# Patient Record
Sex: Female | Born: 1980 | Race: Asian | Hispanic: No | Marital: Single | Smoking: Never smoker
Health system: Southern US, Community
[De-identification: ages and names within clinical notes are randomized; demographics above are authoritative.]

---

## 2001-10-02 ENCOUNTER — Inpatient Hospital Stay (HOSPITAL_COMMUNITY): Admission: AD | Admit: 2001-10-02 | Discharge: 2001-10-04 | Payer: Self-pay | Admitting: Obstetrics and Gynecology

## 2001-11-03 ENCOUNTER — Observation Stay (HOSPITAL_COMMUNITY): Admission: AD | Admit: 2001-11-03 | Discharge: 2001-11-04 | Payer: Self-pay | Admitting: *Deleted

## 2002-02-28 ENCOUNTER — Inpatient Hospital Stay (HOSPITAL_COMMUNITY): Admission: AD | Admit: 2002-02-28 | Discharge: 2002-03-02 | Payer: Self-pay | Admitting: Obstetrics and Gynecology

## 2005-11-15 ENCOUNTER — Other Ambulatory Visit: Admission: RE | Admit: 2005-11-15 | Discharge: 2005-11-15 | Payer: Self-pay | Admitting: Obstetrics and Gynecology

## 2006-01-24 ENCOUNTER — Emergency Department (HOSPITAL_COMMUNITY): Admission: EM | Admit: 2006-01-24 | Discharge: 2006-01-24 | Payer: Self-pay | Admitting: Emergency Medicine

## 2006-10-07 ENCOUNTER — Inpatient Hospital Stay (HOSPITAL_COMMUNITY): Admission: AD | Admit: 2006-10-07 | Discharge: 2006-10-09 | Payer: Self-pay | Admitting: Obstetrics and Gynecology

## 2006-10-10 ENCOUNTER — Encounter: Admission: RE | Admit: 2006-10-10 | Discharge: 2006-11-08 | Payer: Self-pay | Admitting: Obstetrics and Gynecology

## 2007-06-21 ENCOUNTER — Emergency Department (HOSPITAL_COMMUNITY): Admission: EM | Admit: 2007-06-21 | Discharge: 2007-06-21 | Payer: Self-pay | Admitting: Family Medicine
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
## 2007-09-29 ENCOUNTER — Observation Stay (HOSPITAL_COMMUNITY): Admission: AD | Admit: 2007-09-29 | Discharge: 2007-09-30 | Payer: Self-pay | Admitting: Obstetrics and Gynecology

## 2007-10-04 ENCOUNTER — Inpatient Hospital Stay (HOSPITAL_COMMUNITY): Admission: AD | Admit: 2007-10-04 | Discharge: 2007-10-07 | Payer: Self-pay | Admitting: Obstetrics and Gynecology

## 2008-01-12 ENCOUNTER — Encounter: Admission: RE | Admit: 2008-01-12 | Discharge: 2008-01-12 | Payer: Self-pay | Admitting: Obstetrics and Gynecology

## 2008-01-26 HISTORY — PX: LAPAROSCOPY: SHX197

## 2008-01-29 ENCOUNTER — Ambulatory Visit (HOSPITAL_COMMUNITY): Admission: RE | Admit: 2008-01-29 | Discharge: 2008-01-29 | Payer: Self-pay | Admitting: Obstetrics and Gynecology

## 2009-02-25 ENCOUNTER — Emergency Department (HOSPITAL_COMMUNITY): Admission: EM | Admit: 2009-02-25 | Discharge: 2009-02-25 | Payer: Self-pay | Admitting: Family Medicine

## 2009-04-14 IMAGING — US US OB COMP +14 WK
1 series · 14 of 28 positions shown · non-contrast
Comparison: none

OBSTETRICAL ULTRASOUND:

 This ultrasound exam was performed in the [HOSPITAL] Ultrasound Department.  The OB US report was generated in the AS system, and faxed to the ordering physician.  This report is also available in [REDACTED] PACS.

[Series 1: us ob comp +14 wk · 14 of 40 slices shown]
[im 2/40]
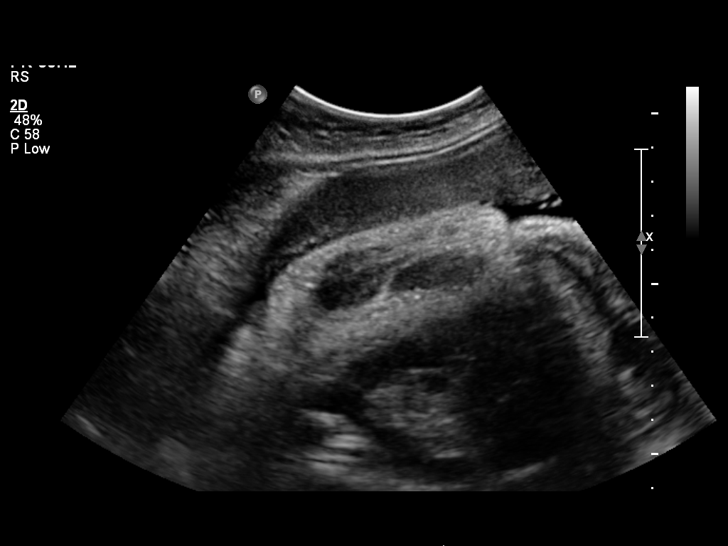
[im 5/40]
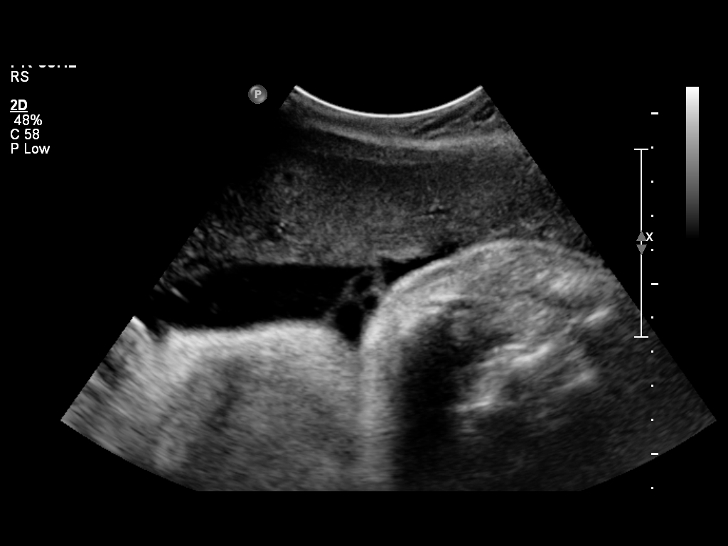
[im 8/40]
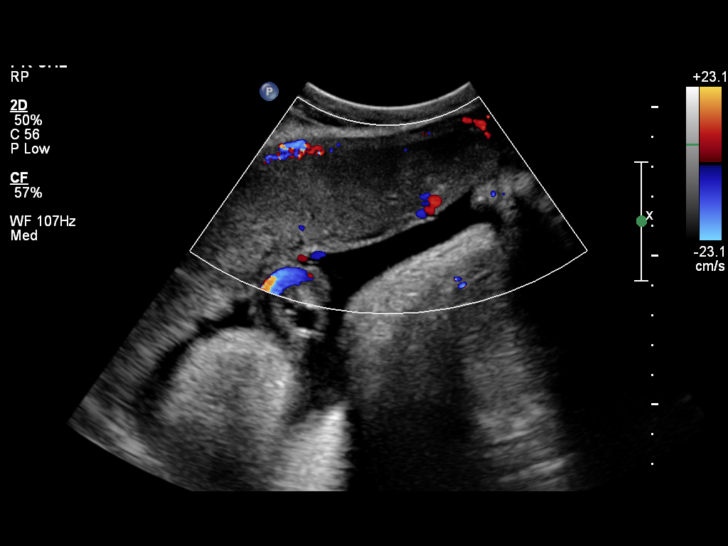
[im 11/40]
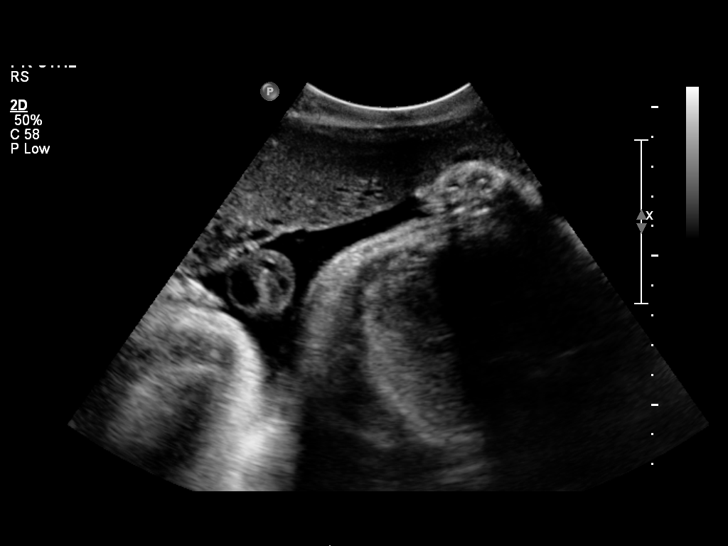
[im 14/40]
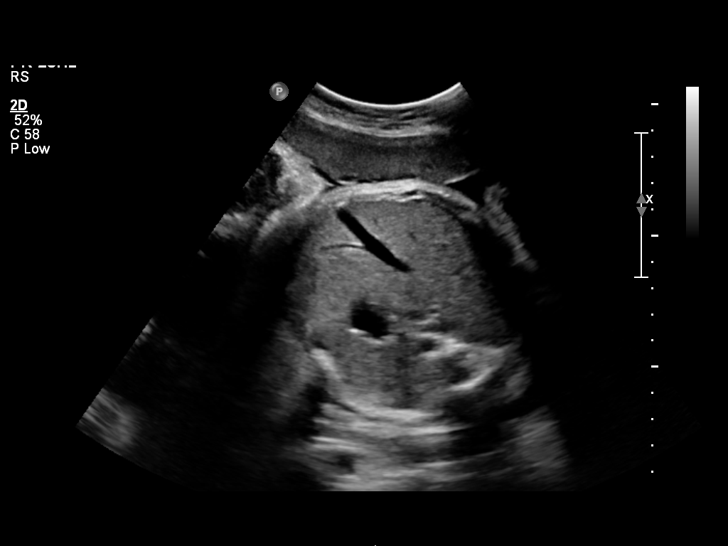
[im 16/40]
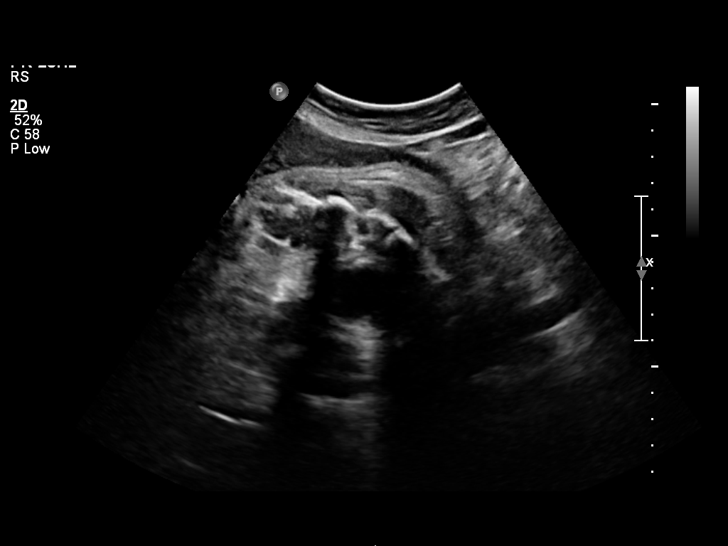
[im 19/40]
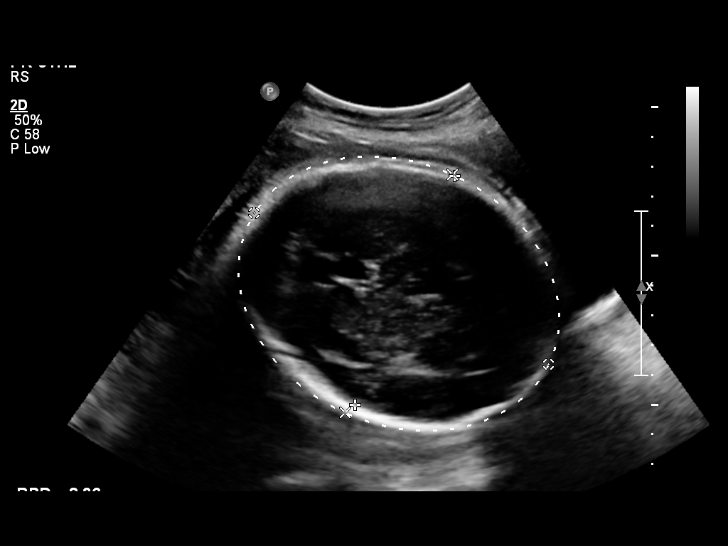
[im 22/40]
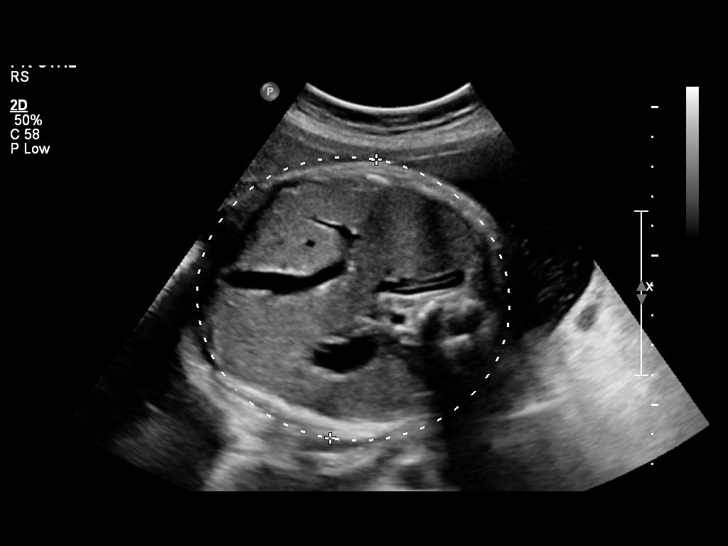
[im 25/40]
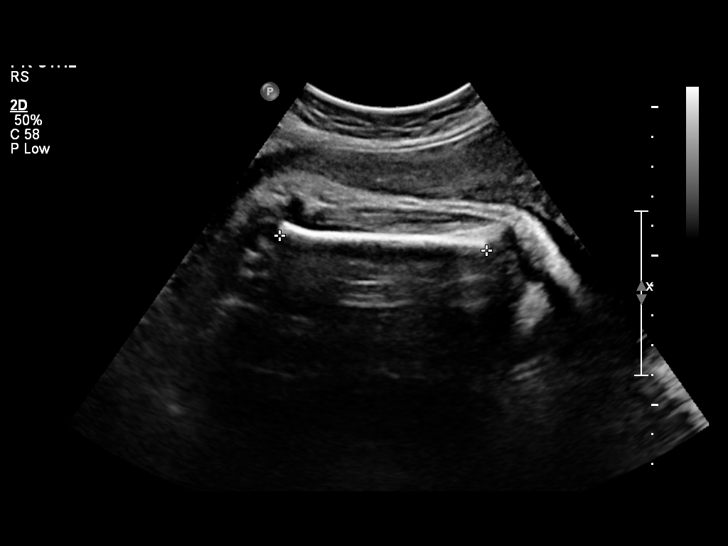
[im 28/40]
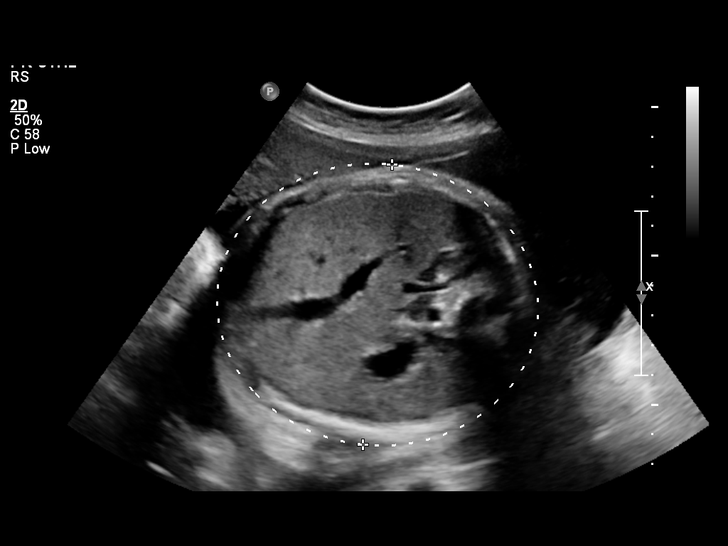
[im 31/40]
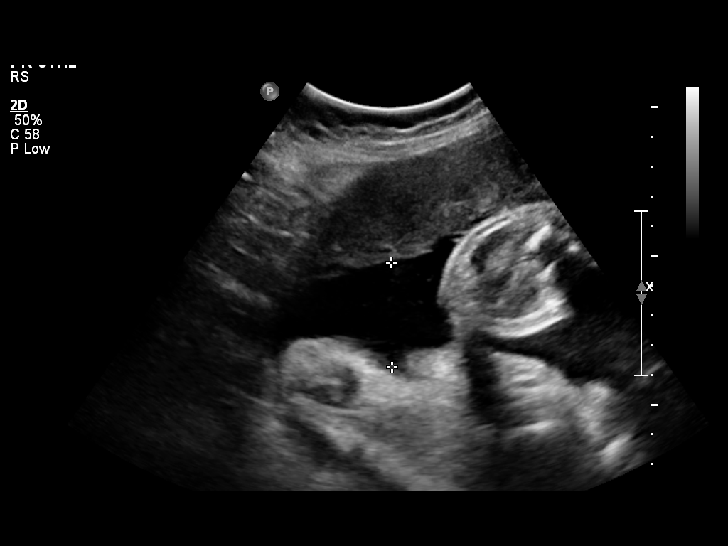
[im 34/40]
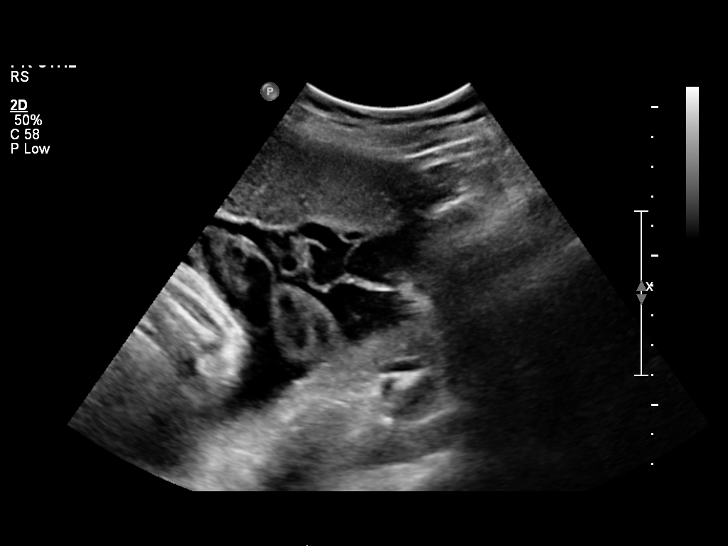
[im 37/40]
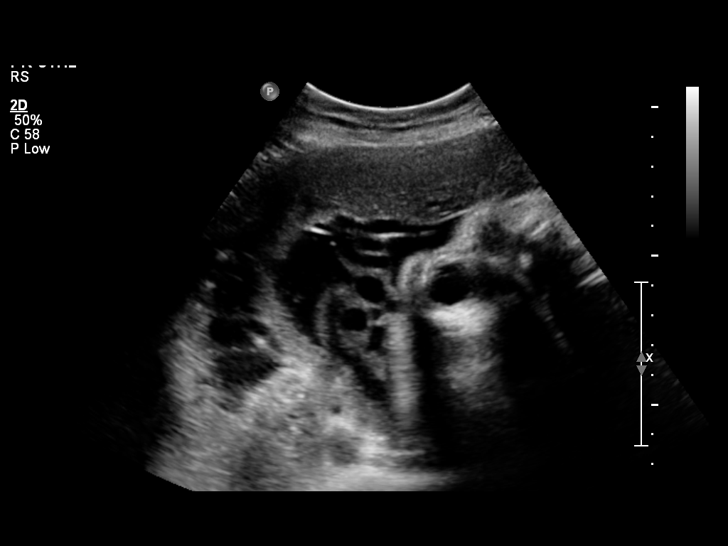
[im 40/40]
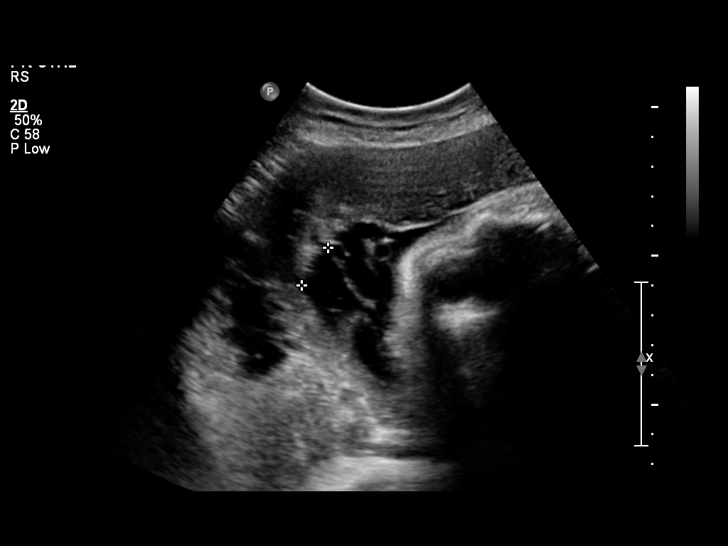

[14 of 28 positions shown; findings below may reference images not displayed]

IMPRESSION: See AS Obstetric US report.

## 2009-07-28 IMAGING — CR DG ABDOMEN 1V
1 series · 1 of 1 positions shown · non-contrast
Comparison: CT scan of the abdomen 01/24/2006.

CLINICAL DATA: Abdominal pain.  Intrauterine device.

ABDOMEN - 1 VIEW

[t abdomen supine]
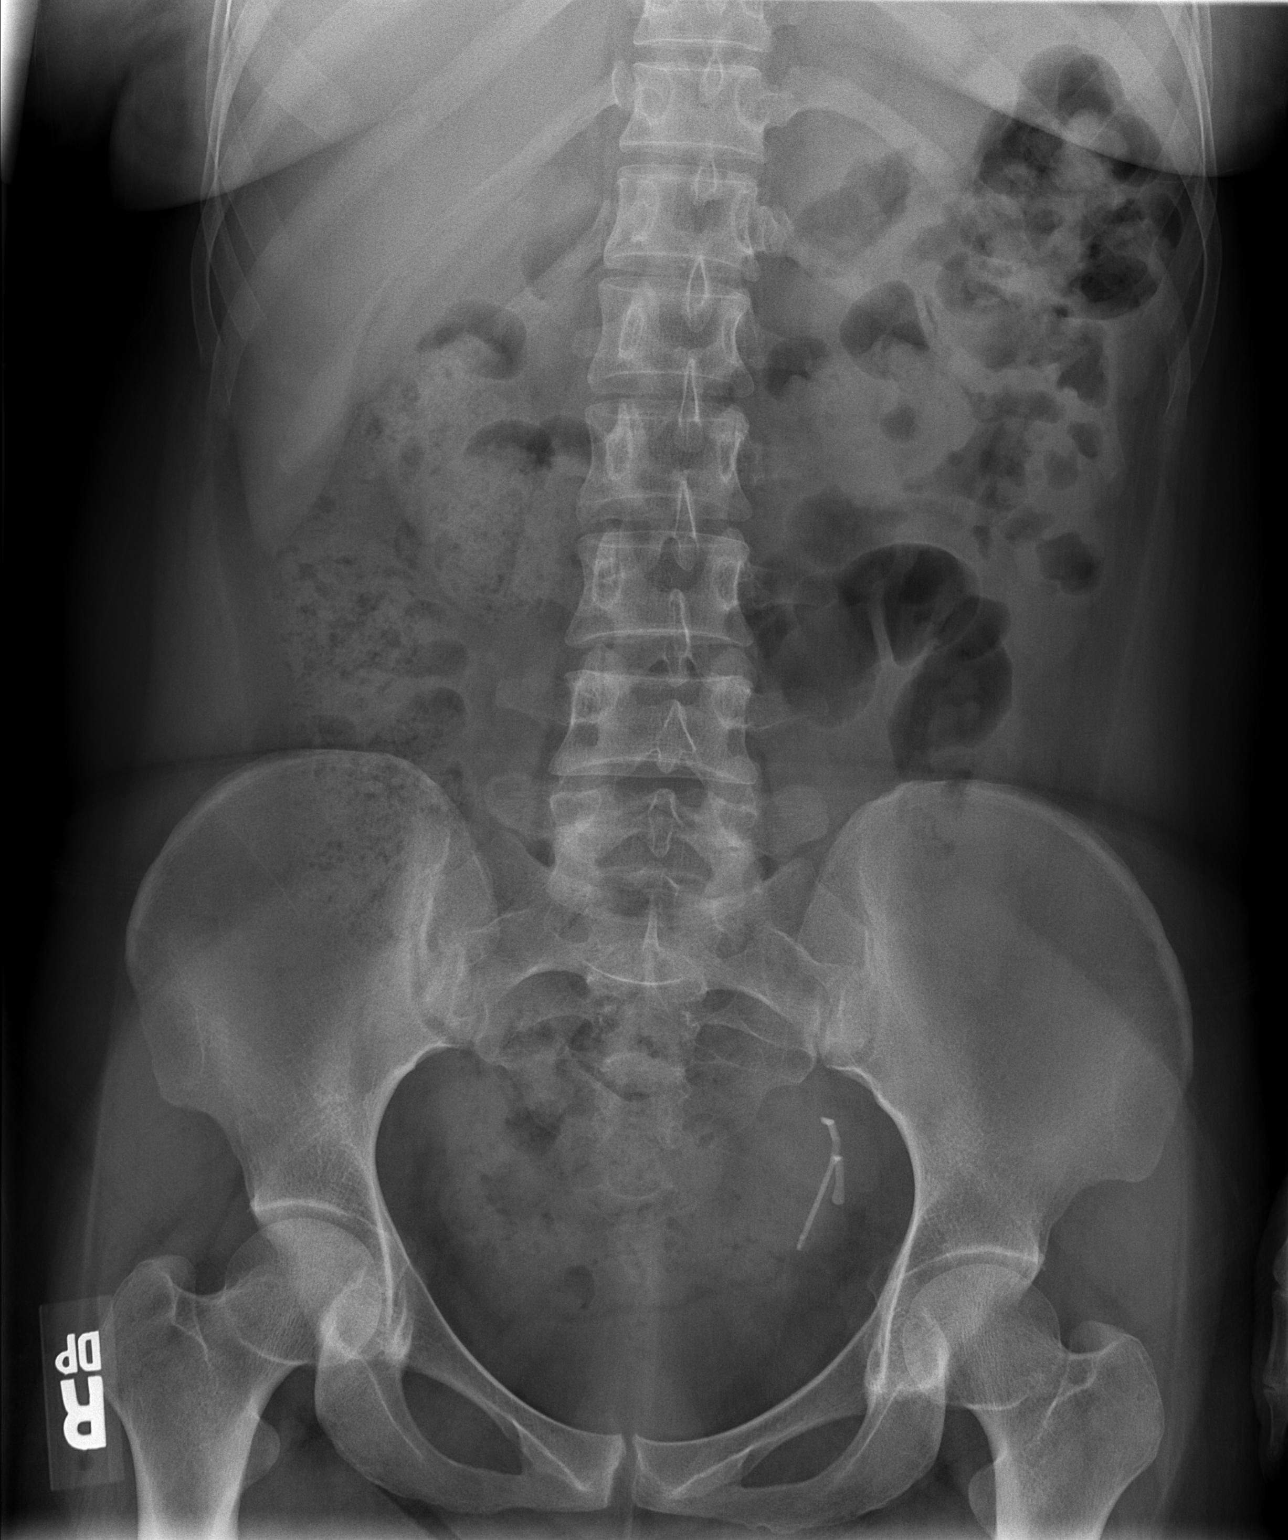

[1 of 1 positions shown; findings below may reference images not displayed]

FINDINGS: The bowel gas pattern is normal.  There is retained stool
noted within the colon.  There is an intrauterine device present
which projects within the left pelvis and may reside outside of the
the uterus.  Dr. Lajecleyda discussed these findings with Dr. Yacoub
who states that a pelvic ultrasound has been performed and the IUD
could not be located within the uterus on the pelvic ultrasound
study. As a result, this is likely in an extra uterine location.
IMPRESSION: Intrauterine device which projects to the left of midline within
the pelvis and is likely in an extrauterine location as discussed
above.  Dr. Lajecleyda discussed these findings with Dr. Yacoub at
[DATE] a.m.. Normal bowel gas pattern.

## 2009-08-14 IMAGING — US US INTRAOPERATIVE - NO REPORT
1 series · 3 of 3 positions shown · non-contrast
Comparison: none

CLINICAL DATA: LOST IUD 

Ultrasound was utilized in the operating room by the requesting physician.

[Series 1: us intraoperative - no report · 0.23mm/px · 3 of 3 slices shown]
[im 1/3]
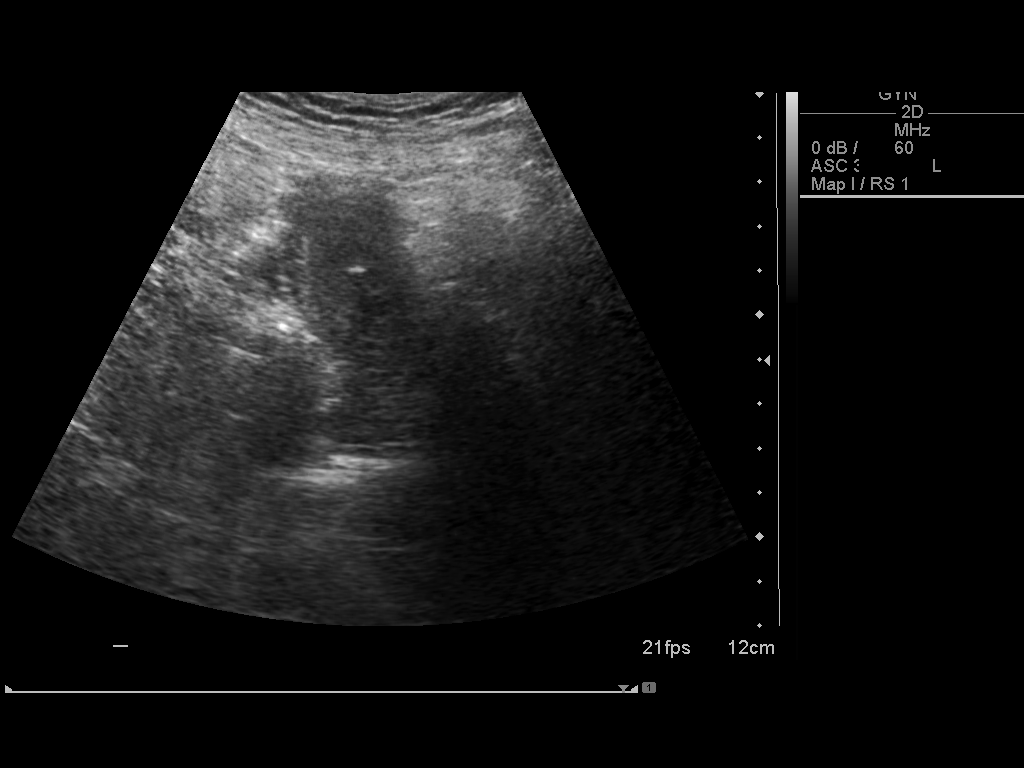
[im 2/3]
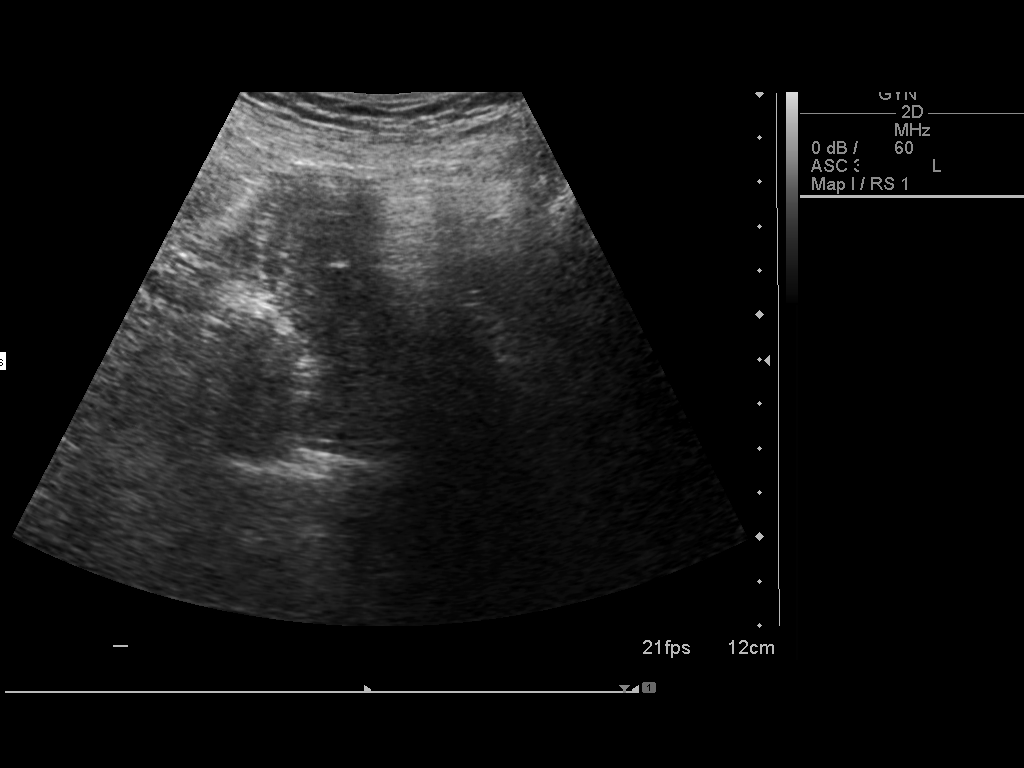
[im 3/3]
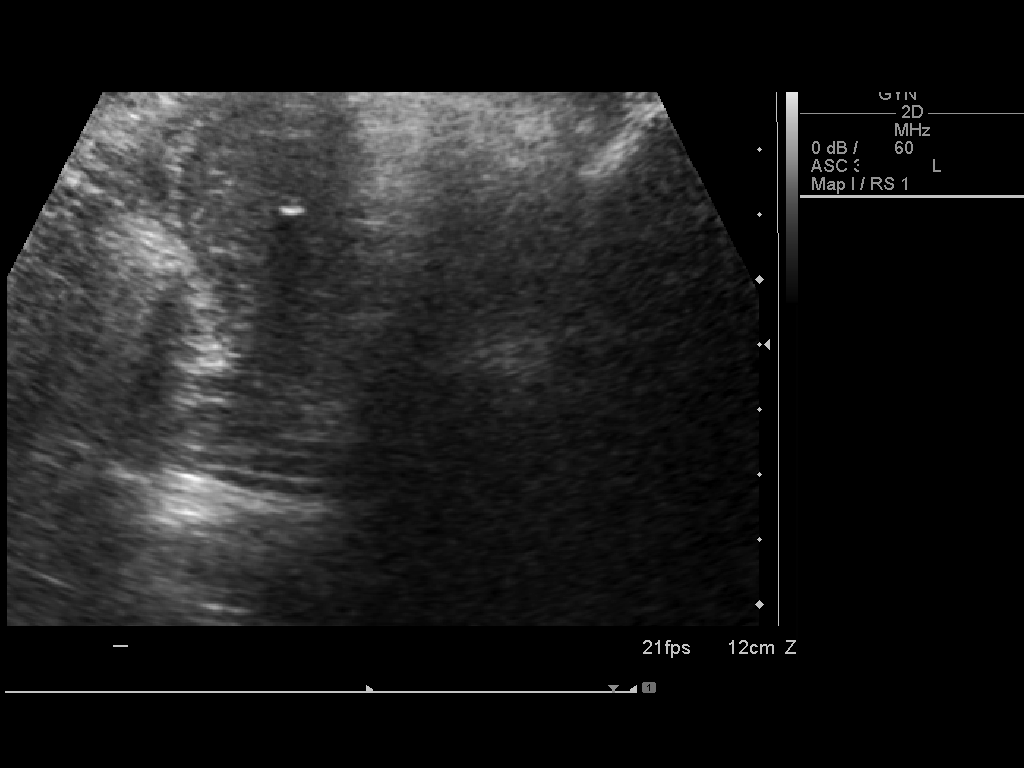

[3 of 3 positions shown; findings below may reference images not displayed]

## 2010-10-18 ENCOUNTER — Encounter: Payer: Self-pay | Admitting: Obstetrics and Gynecology

## 2011-02-09 NOTE — Discharge Summary (Signed)
[name], [name]                 ACCOUNT NO.:  [id number]   MEDICAL RECORD NO.:  [id number]          PATIENT TYPE:  INP   LOCATION:  9157                          FACILITY:  [facility]   PHYSICIAN:  Janine Limbo, M.D.DATE OF BIRTH:  02/14/1981   DATE OF ADMISSION:  09/29/2007  DATE OF DISCHARGE:  09/30/2007                               DISCHARGE SUMMARY   ADMITTING DIAGNOSES:  1. Intrauterine pregnancy at 58 and 6/7 weeks.  2. Status post fall.  3. Intrauterine growth restriction.  4. Irregular uterine contractions without cervical change.   DISCHARGE DIAGNOSES:  1. Intrauterine pregnancy at 51 and 0/7 weeks.  2. Intrauterine contractions, resolved.  3. Intrauterine growth restrictions.   HOSPITAL COURSE:  [name] is a 30 year old gravida 3, para 2-0-0-1  who was admitted at 25 and 6/7 weeks for continuous fetal monitoring due  to a fall.  The patient had noted increased cramping after falling and  landing on her right knee as well as the right side of her abdomen.  The  patient was initially admitted and was noted to be having uterine  contractions in a regular pattern approximately every 1-4 minutes.  The  patient's cervix was noted to be approximately 2-3 cm, 50% effaced, -3  station at the time of admission.  A Kleihauer-Betke was obtained which  returned with negative results.  The patient also underwent ultrasound  to assess placenta and rule out abruption.  Ultrasound returned with  findings of estimated fetal weight at the 37 percentile, 5 pounds 15  ounces and normal fetal indices as well as a normal amniotic fluid index  of 17.64 cm.  Placenta with marginal cord insertion, however, anterior  presentation with no previa noted.  No placental wall abruption was  identified the patient's contractions were treated with Stadol and  Phenergan and Ambien and contractions spaced out to every 7-10 minutes  irregularly after sleeping.  The patient's cervix remained unchanged  at  2-3 cm, 50% effaced, -3 station which is essentially unchanged.  The  patient's fetus remained reactive and reassuring throughout her hospital  stay.  The patient with no leakage of fluid or vaginal bleeding.  The  risks, benefits and alternatives of induction of labor versus discharge  to home were discussed with the patient and her husband via interpreter  with by Dr. Stefano Gaul as well as Debra [name], certified nurse midwife.  At  the present time the patient desires discharge to home.  The patient was  considered to have received full benefit of her hospital stay and was  felt to be stable with reassuring fetal status at the time of discharge.   DISCHARGE INSTRUCTIONS:  The patient was given careful labor  instructions as well as fetal kick counts.   DISCHARGE MEDICATIONS:  Prenatal vitamins.   DISCHARGE FOLLOWUP:  Monday, October 02, 2007, at 3:30 p.m. as previously  scheduled at [facility].      Debra [name], CNM      Janine Limbo, M.D.  Electronically Signed    NOS/MEDQ  D:  09/30/2007  T:  09/30/2007  Job:  [id number]
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
## 2011-02-09 NOTE — Op Note (Signed)
Debra [name], Debra [name]                 ACCOUNT NO.:  [id number]   MEDICAL RECORD NO.:  [id number]          PATIENT TYPE:  AMB   LOCATION:  [facility]C                           [facility]ILITY:  [facility]   PHYSICIAN:  Janine Limbo, M.D.DATE OF BIRTH:  July 15, 1981   DATE OF PROCEDURE:  01/29/2008  DATE OF DISCHARGE:                               OPERATIVE REPORT   PREOPERATIVE DIAGNOSES:  1. Lost Mirena intrauterine device.  2. Desires contraception.   POSTOPERATIVE DIAGNOSES:  1. Lost Mirena intrauterine device.  2. Desires contraception.  3. Perforated uterus.   PROCEDURE:  1. Diagnostic laparoscopy.  2. Laparoscopic removal of Mirena intrauterine device.  3. Placement of Mirena intrauterine device.  4. Ultrasound.   SURGEON:  Janine Limbo, MD   FIRST ASSISTANT:  None.   ANESTHESIA:  General.   DISPOSITION:  Debra [name] is a 30 year old female, para 3-0-0-2, who  presents with an intrauterine device that was found to be within the  abdominal cavity by a CT scan.  The IUD was originally placed in  February 2009.  The patient understands the indications for her surgical  procedure and she accepts the risks of, but not limited to, anesthetic  complications, bleeding, infections, and possible damage to the  surrounding organs.   FINDINGS:  On examination under anesthesia, the uterus was noted to be  anterior and normal sized.  No adnexal masses were appreciated.  On  laparoscopy, the patient was found to have a perforation on the anterior  portion of the uterus and the IUD string was noted to be partially  inside the uterus, but partially outside the uterus.  The Mirena IUD was  located on the posterior portion of the uterus beneath the left adnexa.  A Mirena IUD was placed, but upon inspection of the abdomen was noted to  be partially perforated through the uterus.  An ultrasound was performed  after the IUD was pulled back inside the uterine cavity and the ICD was  noted to be in the  proper intrauterine location.  The fallopian tubes  and ovaries appeared normal.  The abdominal contents appeared normal.  There was minimal bleeding from either perforation site.   PROCEDURE IN DETAIL:  The patient was taken to the operating room where  a general anesthetic was given.  The patient's abdomen, perineum, and  vagina were prepped with multiple layers of Betadine.  Examination under  anesthesia was performed.  A Foley catheter was placed in the bladder.  The patient was then sterilely draped.  The subumbilical area was  injected with 2 mL of 0.5% Marcaine with epinephrine.  An incision was  made and a Veress needle was inserted into the abdominal cavity without  difficulty.  Proper placement was confirmed using the saline drop test.  A pneumoperitoneum was obtained.  The laparoscopic trocar and then the  laparoscope were substituted for the Veress needle.  The pelvis was  carefully inspected with findings as mentioned above.  Pictures were  taken.  The IUD was held using the laparoscopic grasper and was easily  removed from the abdominal  cavity.  Hemostasis was noted to be adequate  from the site of the original perforation.  The patient was then placed  in a more lithotomy position.  The Hulka tenaculum was removed.  Examination again showed an anterior uterus.  A speculum exam was  performed.  A Allis clamp was placed on the cervix.  The uterus sounded  to 9-10 cm.  The Mirena IUD was inserted into the uterine cavity per  protocol.  The string was cut at 2-3 cm.  The operator then changed  gloves.  The pneumoperitoneum was reestablished and attempts were made  to visualize the uterus.  Because we did not have a tenaculum on the  uterus at this point, the lower abdomen was injected with an additional  2 mL of 0.5% Marcaine with epinephrine.  A small incision was made and a  5-mm trocar was inserted into the lower abdomen under direct  visualization.  The bowel was moved from  the pelvis and the uterus was  elevated into our visual field.  At this point, we noted that there was  another perforation at the fundus of the uterus just to the right of the  midline.  Hemostasis was adequate.  We then repeated the pelvic exam and  the IUD string was retracted very slightly.  The operator again changed  gloves and the pneumoperitoneum was reestablished.  The uterus was  elevated into our visual field.  At this point, the IUD was not seen and  hemostasis was noted to be adequate.  The ultrasound department was  called and an ultrasound was performed, documenting that the IUD was  properly located in the endometrial cavity near the fundus of the  endometrial cavity.  At this point, we felt that we were ready to end  our procedure.  The pelvis was again inspected and there was no evidence  of damage to any of the pelvic or abdominal structures.  The instruments  were removed under direct visualization.  The incisions were closed  using 3-0 Monocryl.  Sponge, needle, and instrument counts were correct  on two occasions.  The estimated blood loss for the procedure was 2 mL.  The patient was awakened from her anesthetic without difficulty and then  taken to the recovery room in stable condition.   FOLLOWUP INSTRUCTIONS:  The patient will return to see Dr. Stefano Gaul in 2  weeks and then again in 4 weeks for followup examination.  She was given  a copy of the postoperative instruction sheet as prepared by the [facility] for patients who have undergone laparoscopy.  She  was given a prescription for Motrin and she will take 800 mg every 8  hours as needed for mild-to-moderate pain.  She will take Vicodin 1  tablet to 2 tablets every 4 hours as needed for severe pain.  The  patient received metronidazole for 7 days preoperatively and she  received 1 gram of Ancef intraoperatively.  We felt that additional  antibiotics were not needed.      Janine Limbo, M.D.  Electronically Signed     AVS/MEDQ  D:  01/29/2008  T:  01/30/2008  Job:  [id number]

## 2011-02-09 NOTE — H&P (Signed)
Debra [name], [name]                 ACCOUNT NO.:  [id number]   MEDICAL RECORD NO.:  [id number]          PATIENT TYPE:  AMB   LOCATION:  SDC                           [facility]:  [facility]   PHYSICIAN:  Janine Limbo, M.D.DATE OF BIRTH:  10-06-1980   DATE OF ADMISSION:  DATE OF DISCHARGE:                              HISTORY & PHYSICAL   DATE OF SURGERY:  Jan 29, 2008.   HISTORY OF PRESENT ILLNESS:  The patient is a 30 year old female, para 3-  0-0-2, who presents for laparoscopy to remove a lost Marina intrauterine  device.  She will also have another Marina intrauterine device placed  inside the uterus.  The patient has been followed at the [facility] for  women.  The patient had a Jearld Adjutant IUD placed in February 2009.  The IUD  was noted to be lost.  A CT scan was performed and it showed that the  intrauterine device projected from the left of the midline within the  pelvis.  It was thought to be most likely extrauterine based on this  finding.  Gonorrhea and chlamydia cultures were negative.   OBSTETRICAL HISTORY:  In 2003, the patient had a vaginal delivery of a 6-  pound 12-ounce female infant at term.  The infant had a congenital absence  of the right lung.  The infant died while still in the neonatal  intensive care unit.  In January 2008, the patient had a vaginal  delivery at 66 weeks' gestation of a 5-pound 3-ounce female infant.  In  January 2009, the patient had a vaginal delivery at term of a 6-pound 6-  ounce infant.   ALLERGIES:  No known drug allergies.   PAST MEDICAL HISTORY:  The patient has a history of anemia.   SOCIAL HISTORY:  The patient denies cigarette use, alcohol use, and  recreational drug use.   REVIEW OF SYSTEMS:  Noncontributory.   FAMILY HISTORY:  The patient's father has hypertension.   PHYSICAL EXAMINATION:  VITAL SIGNS:  Weight is 113 pounds.  HEENT:  Within normal limits.  CHEST:   Clear.  Heart:  Regular rate and rhythm.  BREASTS:  Without masses.  ABDOMEN:  Nontender.  EXTREMITIES:  Grossly normal.  NEUROLOGIC:  Grossly normal.  PELVIC:  External genitalia is normal.  Vagina is normal.  Cervix is  nontender.  No IUD string is seen.  Uterus is normal size, shape, and  consistency and adnexa are without masses.  Rectovaginal exam confirms.   ASSESSMENT:  1. Lost intrauterine device.  2. Desires contraception.   PLAN:  The patient will undergo a diagnostic laparoscopy with removal of  a lost intrauterine device.  We will also place another Jearld Adjutant IUD  inside the uterus while we can visualize that it is indeed in the  uterine cavity.  The patient understands the indications for her  procedure and she accepts the risks, but not limited to, anesthetic  complications, bleeding, infection, and possible damage to the  surrounding organs.      Janine Limbo,  M.D.  Electronically Signed     AVS/MEDQ  D:  01/25/2008  T:  01/26/2008  Job:  [id number]

## 2011-02-09 NOTE — H&P (Signed)
NAMESHANI, FITCH NO.:  [id number]   MEDICAL RECORD NO.:  [id number]          PATIENT TYPE:  MAT   LOCATION:  MATC                          [facility]  [facility]   PHYSICIAN:  Janine Limbo, M.D.DATE OF BIRTH:  [birth year]   DATE OF ADMISSION:  09/29/2007  DATE OF DISCHARGE:                              HISTORY & PHYSICAL   The patient is a 30 year old married [place] female gravida 3, para 2-  0-0-1 at 8 and six-sevenths weeks per an [facility] of October 21, 2007, which  was set by an ultrasound for questionable LMP.  She presented to MAU and  was sent from the office status post a fall in the parking lot at  approximately 9 a.m.  She was on her way inside for an NST.  The patient  reports landing on her right knee and right side of her abdomen.  She  denied vaginal bleeding, leakage of fluid, headache, nausea, vomiting,  diarrhea, any dizziness or syncope, UTI signs or symptoms.  She reports  good fetal movement.  She did report increase in kind of crampiness and  contractions since the fall.  The patient was seen for her last return  OB visit on September 20, 2007, had complained of some irregular  contractions that day, and her cervix was 1 cm that day and thick.  The  patient complains of soreness in her right knee today on admission.   Her pregnancy had been followed by the certified nurse midwife service  at [facility]; however, is now being followed by the MD service  secondary to recently diagnosed IUGR on an ultrasound on September 20, 2007.   The patient's history is remarkable for:  1. Language barrier - she is [place] speaking and her interpreter,      Dorene Sorrow, has been at the bedside.  2. The patient has a history of a neonatal death after a term delivery      in January 2008 secondary to absence of right lung development.  3. 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  4.  Also a history of anemia.   OBSTETRICAL HISTORY:  1. Gravida 1 was a vaginal delivery in June 2003, a female at [redacted] weeks      gestation, weighing 6 pounds 12 ounces.  She had an epidural      delivery with vacuum-assisted delivery per Dr. Leonard Schwartz with no other complications noted.  2. Rhett Bannister 2 was a vaginal delivery at approximately 38 weeks of a      female weighing 5 pounds 3 ounces October 07, 2006.  The newborn      did have some congenital anomalies including absence of right lung      development and died later in the NICU after delivery.  3. Rhett Bannister 3 is current pregnancy, with estimated due date of October 21, 2007, per ultrasound.   ALLERGIES:  The patient denies medication  or latex allergies or other  sensitivities.   PAST MEDICAL HISTORY:  She reports a history of anemia, history of a  term infant neonatal death in the NICU status post a term vaginal  delivery secondary to a right lung absence of development.  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  She does report IUD in the past  for contraception, which was taken out January 2007.  She does desire  IUD after this delivery.  She reports normal childhood illnesses and the  anemia.   FAMILY HISTORY:  Unremarkable with the exception of her father having  high blood pressure.   GENETIC HISTORY:  History of the neonatal death secondary to the absence  of right lung development status post term vaginal delivery.   SOCIAL HISTORY:  The patient is a married [place] female.  She speaks  Montagn[place].  She does report being a [place].  Her occupation:  She  works in a Engineer, building services.  She reports 11 years of education.  She  reports that the father of the baby works in Teaching laboratory technician and has 9 years of  education.  Denies any alcohol, tobacco or illicit drug use.   SURGICAL HISTORY:  The patient denies any surgeries in the past.   PRENATAL LABORATORY DATA:  The patient's blood type is O  positive.  Her  antibody screen was negative.  RPR was nonreactive.  She is rubella  immune.  HIV was nonreactive.  GC and chlamydia cultures were negative.  At her new OB visit March 29, 2007 - or that was her first date of care,  her hemoglobin at that time was 11.5, hematocrit 37.4, platelets were  339.  Hepatitis surface antigen was negative.  Cystic fibrosis screen  was negative.  The patient's one-hour GTT was within normal limits,  equal to 127, and RPR at that time was nonreactive.  That was at 27 and  four-sevenths weeks.  Her hemoglobin at that time was 94.  Her last Pap  smear was November 22, 2006, and was within normal limits.   HISTORY OF PRESENT PREGNANCY:  The patient entered care for a new OB  workup at approximately 9 and two-sevenths weeks.  At her new OB visit  she did have HSV cultures done.  She did elect to have first-trimester  screen.  The patient returned in approximately 3 weeks at 12 and two-  sevenths weeks and had an ultrasound which was within normal limits, and  due date was changed to October 21, 2007.  The patient did not return  for a first trimester screen.  There was an ultrasound done on July 28  at 14 and two-sevenths weeks which was too late for a first-trimester  nuchal translucency.  At her visit at 16 and two-sevenths weeks the  patient was offered quad screen.  However, she declined.  The patient  had an anatomy screen at 19 and one-seventh weeks showing SIUP with size  consistent with dates.  Cervical length was 3.45.  she had an anterior  placenta with normal anatomy, normal growth and development, and normal  fluid.  The patient's pregnancy progressed without complications.  She  did have a normal one-hour GTT around 27 and four-sevenths weeks with a  negative RPR.  Her hemoglobin at that date was equal to 9.4.  She was  given a prescription for Repliva one tablet p.o. daily and teaching  regarding iron-rich foods.  She presented at  approximately 48 and  four-  sevenths weeks with a complaint of leakage of fluid in the past but was  not leaking at that time.  She did have a negative wet prep and sterile  speculum exam was negative as well x3.  At approximately 33 and four-  sevenths weeks the patient's urine was sent for culture secondary to  positive Chem-9.  She did have an uncomfortable cough which she was  prescribed Tussionex for.  At approximately 35 and four-sevenths weeks  the patient was scheduled for a followup ultrasound for growth and BPP  secondary to a history of a neonatal death with her previous pregnancy  from absence of a lung developing.  The ultrasound showed size less than  dates.  Estimated fetal weight was 5 pounds 8 ounces, which was 46th  percentile.  However, biparietal diameter was measuring less than the  2nd percentile and head circumflex was also measuring less than the 2nd  percentile.  Cervical length was 4.19 cm.  AFI was 17.4, which was 65th  percentile.  Placenta was a grade 1 and BPP was 8/8.  Results were  reviewed with the patient utilizing interpreter named Dorene Sorrow and  consulted with Dr. Su Hilt.  The patient's plan was to begin antenatal  testing with twice-a-week NSTs and was to receive maternal fetal  medicine consult as soon as possible with plan for followup ultrasound  for estimated fetal weight and BPP in 2 weeks with a doctor visit, and  to follow MDs for care secondary to intrauterine growth restriction.  The patient's cervix was checked per patient request and was 1, long and  -2.  Group beta strep was obtained on that day.   PHYSICAL EXAMINATION:  On admission to MAU, the patient's vital signs  were blood pressure 122/80.  Heart rate was tachycardic, running 93-108.  Temperature 98.3.  Respirations were 16.  EXTERNAL MONITORING:  Fetal heart rate was 140-145 with moderate  variability and reactive, with an occasional mild variable but no other  decelerations.  Toco  showing mild uterine contractions every 1-4 minutes  with irritability.  GENERAL:  No acute distress.  She was alert and oriented x4.  Her skin  was warm and dry.  No cuts, abrasions or bruising was normal.  HEENT:  Within normal limits, grossly intact.  BREASTS:  Soft and troponin.  CARDIOVASCULAR:  Regular rate and rhythm without murmur.  LUNGS:  Clear to auscultation bilaterally.  ABDOMEN:  Soft, nontender, gravid.  No bruising or abrasions noted.  CERVICAL EXAM:  The patient's cervix was 2+, 40-50%, -2, and vertex.  EXTREMITIES:  Were within normal limits, no edema.  The patient had ice  pack to her right knee at time of exam.   ASSESSMENT:  1. Intrauterine pregnancy at 64 and two-sevenths weeks.  2. Status post a fall.  3. Intrauterine growth restriction.  To note, the patient reports per      interpreter [facility] consult earlier this week.  However,      formal results are not available at time of this dictation.  Have      called office to see if consult results could be obtained.  4. Maternal tachycardia.  5. Reassuring fetal heart tracing with persisting contractions and      irritability.   PLAN:  Consulted with Dr. Leonard Schwartz.  The patient to be  admitted for 23-hour observation.  Labs, CBC and KB to be drawn.  To  continue continuous external monitoring.  The patient to  receive  complete OB ultrasound status post fall to rule out abruption and  secondary to history of IUGR.  The patient to have IV started with  lactated Ringer's at 125/hour after a 500 mL bolus.  Vital signs q.4h.  The patient to be on bedrest with bathroom privileges.      Candice Denny Levy, [place]      Janine Limbo, M.D.  Electronically Signed    CHS/MEDQ  D:  09/29/2007  T:  09/29/2007  Job:  [id number]

## 2011-02-09 NOTE — Op Note (Signed)
Debra [name], Debra [name]                 ACCOUNT NO.:  [id number]   MEDICAL RECORD NO.:  [id number]          PATIENT TYPE:  INP   LOCATION:  9371                          [facility]   PHYSICIAN:  Janine Limbo, M.D.DATE OF BIRTH:  January 24, 1981   DATE OF PROCEDURE:  10/05/2007  DATE OF DISCHARGE:                               OPERATIVE REPORT   PREOPERATIVE DIAGNOSES:  1. Postpartum day zero.  2. Postpartum hemorrhage.  3. Anemia (hemoglobin 7.3).   POSTOPERATIVE DIAGNOSES:  1. Postpartum day zero.  2. Postpartum hemorrhage.  3. Anemia (hemoglobin 7.3).   PROCEDURE:  1. Uterine exploration.  2. Placement of a Bakri balloon.   SURGEON:  Dr. Leonard Schwartz   FIRST ASSISTANT:  None.   ANESTHETIC:  Monitored anesthetic control.   DISPOSITION:  Ms. Zeigler is a 30 year old female, now para 3-0-0-2, who had  a vaginal delivery of a 6 pound, 9 ounce female infant on October 04, 2007.  The patient's pregnancy was complicated by anemia (predelivery  hemoglobin is 9.3), questionable IUGR, and the fact that she had an  infant that died from congenital absence of the lungs.  The patient had  an uneventful vaginal delivery.  Postpartum she was noted to have extra  bleeding and she was given Pitocin through her IV line as well as 100  mcg of Cytotec rectally.  She continued to have bleeding and she was  given a total of 500 mcg of Hemabate IM.  The patient continued to have  bleeding and she was noted to be hypotensive.  Her pulse was  approximately 100-120.  The decision was made to proceed with placement  of a Bakri balloon.  We discussed our management options which included  placement of the Bakri balloon, a B-Lynch suture, and possible  hysterectomy.  The risk and benefits of each of these options were  reviewed.  Because the patient does not speak English well, we reviewed  the options with her husband.  We also had an interpreter to review the  options with the patient and  with her husband.  The risks associated  with the procedures were outlined including, bleeding, infections,  anesthetic complications, and possible damage to surrounding organs.  The patient was given Ancef preoperatively.  She was given 1 unit of  packed red blood cells prior to her procedure and a second unit of  packed cells is ready for transfusion.   FINDINGS:  The uterus was noted to be only moderately firm.  There was a  small amount of bleeding at this point coming from the uterus.  No  adnexal masses could be appreciated.  The vagina was carefully inspected  and there were no lacerations in the vagina.  The cervix was carefully  inspected and there were no lacerations on the cervix.  The uterus was  explored using a large curette and there was no evidence of retained  products of conception.   PROCEDURE:  The patient was taken to the operating room where she was  given medication through her IV line.  She was placed  in a lithotomy  position.  The lower abdomen, perineum, and vagina were prepped with  multiple layers of Betadine.  The patient was then sterilely draped.  A  Foley catheter had previously been placed.  The vagina was carefully  inspected and there was no evidence of any lacerations.  The cervix was  carefully inspected and no lacerations were noted.  The uterine cavity  was then explored using a banjo curette and there were no retained  products of conception.  There were no abnormalities noted within the  uterine cavity.  Because the cervix was completely dilated, the decision  was made not to have an ultrasound to guide the Bakri balloon.  The  operator then used his operating hand to place the Bakri balloon at the  fundus of the uterus.  Holding the balloon in place, the balloon was  inflated using a total of 470 mL of sterile saline.  The vagina was then  packed with gauze.  A very small amount of blood was noted to pass  through the port of the Bakri balloon.   The patient tolerated the  procedure well.  The second unit of blood was transfused during the  operative procedure.  She was awakened from her anesthetic without  difficulty and then taken to the recovery room in stable condition.  The  patient will be observed in the adult intensive care unit at the [facility] after her recovery.  The estimated blood loss for  the total procedure was approximately 50 mL.      Janine Limbo, M.D.  Electronically Signed     AVS/MEDQ  D:  10/05/2007  T:  10/05/2007  Job:  [id number]

## 2011-02-12 NOTE — H&P (Signed)
[facility]  Patient:    TAILEY, TOP Visit Number: [id number] MRN: [id number]          Service Type: OBS Location: 9300 [id number] Attending Physician:  Leonard Schwartz Dictated by:   Marcelle Smiling Clelia Croft, C.N.M. Admit Date:  10/02/2001                           History and Physical  DATE OF BIRTH:  06-Aug-1981.  HISTORY OF PRESENT ILLNESS:  Ms. Forde Radon is a 30 year old female, primigravida, at approximately 16-1/[redacted] weeks gestation who presents in the office for lower back pain and elevated temperature. She denies any leaking or vaginal bleeding, and also denies headache, visual disturbances, and nausea and vomiting. Her pregnancy has been followed by the [facility] certified nurse midwife service and has been unremarkable.  OBJECTIVE DATA:  VITAL SIGNS:                  Temperature is elevated, other vital signs stable.  HEENT:                        Grossly within normal limits.  CHEST:                        Clear to auscultation.  HEART:                        Regular to rate and rhythm.  ABDOMEN:                      Soft and nontender with fundal height approximately 16 cm above the pubic symphysis. Fetal heart tones are audible in the 160s by Doppler.  BACK:                         Her back is remarkable for CVA tenderness.  PELVIC:                       Deferred.  EXTREMITIES:                  Within normal limits.  LABORATORY DATA:              Clean catch urinalysis is positive.  ASSESSMENT:                   1. Intrauterine pregnancy at 16 weeks.                               2. Pyelonephritis.  PLAN:                         1. Admit to [facility] per Dr. Stefano Gaul.                               2. Begin IV antibiotic treatment.                               3. Send urine culture.  4. To be followed by the MD service. Dictated by:   Marcelle Smiling Clelia Croft, C.N.M. Attending  Physician:  Leonard Schwartz DD:  10/02/01 TD:  10/02/01 Job: [phone] [id number]

## 2011-02-12 NOTE — Op Note (Signed)
[facility]  Patient:    Debra [name], Debra [name] Visit Number: [id number] MRN: [id number]          Service Type: OBS Location: 9300 [id number] Attending Physician:  Leonard Schwartz Dictated by:   Janine Limbo, M.D. Proc. Date: 02/28/02 Admit Date:  02/28/2002 Discharge Date: 03/02/2002                             Operative Report  PREOPERATIVE DIAGNOSES:       1. Term intrauterine pregnancy.                               2. Arrested second stage of labor.  POSTOPERATIVE DIAGNOSES:      1. Term intrauterine pregnancy.                               2. Arrested second stage of labor.                               3. Partial third degree laceration.  OPERATION:                    1. Vacuum extraction vaginal delivery.                               2. Right medial lateral episiotomy with repair.  SURGEON:                      Janine Limbo, M.D.  ANESTHESIA:                   Epidural and local Xylocaine.  DISPOSITION:                  The patient is a 30 year old female, gravida 1, para 0, who presents at [redacted] weeks gestation in active labor.  She received an epidural for discomfort.  She dilated her cervix to 10 cm and she was 100% effaced.  She pushed for greater than two hours and the head could be seen at the introitus.  She was unable to push the fetal head any further.  She began to become exhausted from pushing.  We discussed our options for care.  Her mother acted as an Equities trader.  We gave the patient options of continued pushing, cesarean delivery, and operative vaginal delivery.  We discussed the benefits of those options.  The patient elected to proceed with operative vaginal delivery.  We discussed forceps delivery as well as vacuum extraction vaginal delivery.  The patient elected to proceed with vacuum extraction vaginal delivery.  The specific risks associated with vacuum extraction and vaginal delivery were then reviewed  through the interpreter including a risks of caput formation, hematoma formation, intracranial bleeding, and the risks that the vacuum extraction would be unsuccessful, and that we would need to proceed with cesarean delivery.  The patient indicated that she understood. The father of the baby also indicated that he understood those risks and all family members and the patient were ready to proceed with vacuum extraction.  FINDINGS:                     The  weight of the infant is currently not known. A female was delivered from a cephalic presentation.  The Apgars were 8 at one minute and 9 at five minutes.  There was a three vessel umbilical cord present.  The placenta appeared normal.  There was a partial third degree extension of the episiotomy.  There were no upper vaginal or cervical lacerations.  DESCRIPTION OF PROCEDURE:     The patient was placed in a lithotomy position. The perineum was prepped with multiple layers of Betadine.  The bladder was drained of urine.  The patient was sterilely draped.  The Kiwi vacuum extractor wass applied, but we could not get a good seal.  The Kiwi wa replaced using another hand operated vacuum extractor.  We were able then to deliver the fetal head with the mother pushing.  The infant was in an occiput anterior presentation.  The mouth and nose were suctioned.  The remainder of the infant was then delivered.  The cord was clamped and cut, and the infant was allowed to rest on the mothers abdomen for bonding.  Routine cord blood studies were obtained.  The placenta was removed.  The upper vagina was inspected and there were no lacerations noted.  The medial lateral episiotomy was repaired using interrupted sutures of 2-0 Vicryl in the capsule followed by a routine closure of the vaginal laceration using 2-0 Vicryl.  Prior to initiation of the vacuum extraction, the perineum was injected with 6 cc of 1% Xylocaine and a right medial lateral episiotomy  was cut.  The patient tolerated her procedure well.  The estimated blood loss was 300 cc.  The mother was returned to the supine position after her repair was completed.  The infant again was allowed to remain in the room with the mother and the family for bonding. Dictated by:   Janine Limbo, M.D. Attending Physician:  Leonard Schwartz DD:  02/28/02 TD:  03/02/02 Job: [phone] [id number]

## 2011-02-12 NOTE — Discharge Summary (Signed)
[facility]  Patient:    Debra [name], Debra [name] Visit Number: [id number] MRN: [id number]          Service Type: OBS Location: 9300 [id number] [id number] Attending Physician:  Leonard Schwartz Dictated by:   Mack Guise, C.N.M. Admit Date:  10/02/2001 Disc. Date: 10/04/01                             Discharge Summary  ADMITTING DIAGNOSES:          1. Intrauterine pregnancy at 16 weeks.                               2. Pyelonephritis.  DISCHARGE DIAGNOSES:          1. Intrauterine pregnancy at 16 weeks.                               2. Pyelonephritis.  HISTORY OF PRESENT ILLNESS:   Ms. Kahli is a 30 year old [place] prima gravida who presented at approximately [redacted] weeks gestation with symptoms consistent with pyelonephritis.  She has been treated with IV antibiotics with good effect.  She is voiding without difficulty.  She has remained afebrile for 24 hours.  Her temperature max was 101.6 on January 7 and she has been afebrile since that time with no difficulties.  She is deemed to be in satisfactory condition for discharge.  DISCHARGE INSTRUCTIONS:       Per [facility].  DISCHARGE MEDICATIONS:        She will receive a prescription for Septra DS 1 p.o. b.i.d. for seven days.  This was explained to her through her interpreter.  FOLLOWUP:                     She will return to the office of [facility] on October 18, 2001 at 11 a.m.  She will call for any increased signs or symptoms of infection; any pain, cramping, bleeding, or any problems or concerns. Dictated by:   Mack Guise, C.N.M. Attending Physician:  Leonard Schwartz DD:  10/04/01 TD:  10/04/01 Job: [id number]
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
## 2011-02-12 NOTE — H&P (Signed)
NAME:  Debra [name], Debra [name]                 ACCOUNT NO.:  [id number]   MEDICAL RECORD NO.:  [id number]          PATIENT TYPE:  INP   LOCATION:                                FACILITY:  [facility]   PHYSICIAN:  Dois Davenport A. Rivard, M.D. DATE OF BIRTH:  [birth year]   DATE OF ADMISSION:  10/07/2006  DATE OF DISCHARGE:                              HISTORY & PHYSICAL   Debra [name] is a 31 year old gravida 2, para 1-0-0-1 at 38-1/7 weeks, who  presents today for induction secondary to IUGR.  Her history has been  remarkable for:  1. Language barrier.  2. History of IUGR with her previous pregnancy.  3. IUGR with this pregnancy.   PRENATAL LABS:  Blood type is O positive, Rh antibody negative, VDRL  nonreactive.  Rubella titer is positive, Hepatitis B surface antigen  negative. HIV is nonreactive.  Varicella titer was immune.  GC Chlamydia  cultures were negative in the first trimester and negative at 36 weeks.  Hemoglobin upon entry in the practice is 10.6.  It was 10.1 at 32 weeks.  Quadruple screen was declined.  She had a one-hour Glucola of 143.  Three-hour GTT was normal.  Group B strep culture and other cultures  were negative at 36 weeks.  EDC of October 20, 2006 was established by  last menstrual period and was in agreement with ultrasound at  approximately 18 weeks.   HISTORY OF PRESENT PREGNANCY:  Patient entered care at approximately 14  weeks.  She had an interpreter present with her with most visits.  She  declined cystic fibrosis testing.  She declined quadruple screen.  She  had a ultrasound at 17 weeks showing EDC of November 08, 2006.  However,  by LMP, EDC was 124, which was determined to be the best EDC.  I was  unable to see several heart structures.  Her ultrasound was repeated at  24 weeks.  There was an elevated head to abdomen ratio and a low  abdominal circumference percentile.  Plan was made to follow her growth.  She had another ultrasound at 24 weeks, showing head to abdomen  circumference normal.  Abdominal circumference was lagging slightly  behind.  At 28 weeks, she had a DEXA stick of 159.  Glucola was  deferred, secondary to that.  Fasting blood sugar was done on the  following visit, which was normal.  She also had an ultrasound at that  time with estimated fetal weight in the 20th percentile and normal  fluid.  At 32 weeks, fundal height was at 30 weeks.  Hemoglobin was  10.1.  Ultrasound showed 15 to 16% growth.  One-hour Glucola was  elevated at 143.  A 3-hour GGT was done and was normal.  At 36 weeks,  patient had grossed a second percentile normal fluid, normal Dopplers  and BPP.  The plan was made to do NSTs twice weekly.  Group B strep and  cultures were done and were negative.  NSTs have been reactive, but the  decision was made to proceed with induction, in light of IUGR versus  SGA.   OBSTETRIC HISTORY:  In 2003, patient had a vaginal birth of a female  infant, weight 6 pounds, 12 ounces at 39 weeks.  She was in labor 8  hours.  She did have a vacuum-assisted vaginal birth.   MEDICAL HISTORY:  IUD was removed in January of 2007.  She reports usual  childhood illnesses.  Patient was hospitalized in 2002 in [place] for  general body aches.   ALLERGIES:  SHE HAS NO KNOWN MEDICATION ALLERGIES.   FAMILY HISTORY:  Her father has high blood pressure.  Genetic history is  unremarkable.   SOCIAL HISTORY:  Patient is from [place].  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  She has 11 years of schooling.  She is employed as a  sewer.  Her partner has a 9th grad education.  He is employed in [employer].  She is single.  The father of the baby is involved  and supportive.  His name is Zara Chess.  The patient also has an  interpreter that she works with, [employer].  Patient denies any alcohol,  drug or tobacco use during this pregnancy.   PHYSICAL EXAMINATION:  VITAL SIGNS:  Stable.  Patient is afebrile.  HEENT:  Within normal  limits.  LUNGS:  Breath sounds are clear.  HEART:  Regular rate and rhythm without murmur.  BREASTS:  Soft and nontender.  ABDOMEN:  Fundal height is approximately 34 to 35 cm.  This is vertex.  Last exam, cervix was 1 cm, 50% vertex -2.  Fetal heart rate has been  reactive on NSTs in the office.  EXTREMITIES:  Deep tendon reflexes are 2+ without clonus.  There is a  trace edema noted.   IMPRESSION:  1. Intrauterine pregnancy at 38-1/7 weeks.  2. Intrauterine growth restriction.  3. Group B Strep negative.   PLAN:  1. Admit to birthing suite per consult with Dr. Silverio Lay as      attending physician.  2. Plan Pitocin induction of labor.  3. Close observation of fetal heart rate status.  4. Pain medication per patient request.  5. Vaginal exam will be accomplished, once the patient arrives and is      evaluated.      Renaldo Reel Emilee Hero, C.N.M.      Crist Fat Rivard, M.D.  Electronically Signed    VLL/MEDQ  D:  10/06/2006  T:  10/06/2006  Job:  [id number]

## 2011-02-12 NOTE — H&P (Signed)
[facility]  Patient:    Debra [name], Debra [name] Visit Number: [id number] MRN: [id number]          Service Type: OBS Location: 910B 9162 01 Attending Physician:  Leonard Schwartz Dictated by:   Mack Guise, C.N.M. Admit Date:  02/28/2002                           History and Physical  HISTORY OF PRESENT ILLNESS:   The patient is a 30 year old gravida 1 para 0 who presents at 39 weeks in early active labor.  She reports positive fetal movement, no bleeding, no rupture of membranes, although she does report a bloody show.  Denies any headache, visual changes, or epigastric pain.  She is a [place] patient with her interpretor present.  Her pregnancy has been followed by the M.D. service at [facility] and is remarkable for: 1. Language barrier. 2. Group B strep negative. 3. IUGR.  Her pregnancy was initially evaluated at the office of [facility] on September 18, 2001 at approximately [redacted] weeks gestation.  Her pregnancy has been essentially unremarkable.  She has been size less than dates, and ultrasound at 28 weeks was at the 57th to 58th percentile; at 35 weeks was less than the tenth percentile for IUGR.  EDC was determined by pregnancy ultrasonography and confirmed with follow-up ultrasound.  She has been normotensive with no proteinuria.  PRENATAL LABORATORY DATA:     On September 22, 2002, hemoglobin and hematocrit 11.3 and 36.2; platelets 286,000.  Blood type and Rh O positive, antibody screen negative.  VDRL nonreactive.  Rubella immune.  Hepatitis B surface antigen negative.  GC and chlamydia negative.  AFP/free beta hCG within normal range.  One-hour glucose challenge at 28 weeks was 90 and hemoglobin 9.0.  At 36 weeks culture of the vaginal tract is negative for group B strep.  MEDICAL HISTORY:              Unremarkable.  FAMILY HISTORY:               Maternal grandfather - hypertension.  GENETIC HISTORY:              Unremarkable.  There is no  family history of genetic disorders, children that that died in infancy or that were born with birth defects.  ALLERGIES:                    No known drug allergies.  HABITS:                       Denies the use of tobacco, alcohol, or illicit drugs.  SOCIAL HISTORY:               The patient is a 30 year old Asian single female.  Her partner is involved and supportive.  They are [place] in their faith.  REVIEW OF SYSTEMS:            There are no signs or symptoms suggestive of focal or systemic disease and the patient is typical of one with a uterine pregnancy at term in early active labor.  PHYSICAL EXAMINATION:  VITAL SIGNS:                  Stable, afebrile.  HEENT:                        Unremarkable.  HEART:  Regular rate and rhythm.  LUNGS:                        Clear.  ABDOMEN:                      Gravid in its contour.  Uterine fundus is noted to extend 36 cm above the level of the pubic symphysis.  Leopolds maneuvers find the infant to be in a longitudinal lie, cephalic presentation, and the estimated fetal weight is 6.5 pounds.  The fetal heart rate is reassuring. The patient is contracting every three minutes.  PELVIC:                       Digital exam of the cervix finds it to be 3-4 cm dilated, 75% effaced, with the cephalic presenting part at a -1 station.  Membranes were ruptured by Dr. Stefano Gaul and are clear.  EXTREMITIES:                  Show no pathologic edema.  DTRs are 1+ with no clonus.  ASSESSMENT:                   Intrauterine pregnancy at term, early active labor.  PLAN:                         1. Admit per Dr. Marline Backbone.                               2. Routine M.D. orders. Dictated by:   Mack Guise, C.N.M. Attending Physician:  Leonard Schwartz DD:  02/28/02 TD:  02/28/02 Job: [id number]

## 2011-02-12 NOTE — Discharge Summary (Signed)
Debra [name]  Patient:    Debra [name], Debra [name] Visit Number: [id number] MRN: [id number]          Service Type: OBS Location: 9300 9325 01 Attending Physician:  Leonard Schwartz Dictated by:   Saverio Danker, C.N.M. Admit Date:  02/28/2002 Discharge Date: 03/02/2002                             Discharge Summary  ADMISSION DIAGNOSES:          1. Intrauterine pregnancy at term.                               2. Early active labor.                               3. Group B Streptococcus negative.                               4. Language barrier.  DISCHARGE DIAGNOSES:          1. intrauterine pregnancy at term.                               2. Early active labor.                               3. Group B Streptococcus negative.                               4. Language barrier.                               5. Arrest of second stage of labor, status post                                  vacuum-assisted vaginal delivery.                               6. Breast-feeding.                               7. Desires intrauterine device for                                  contraception.  PROCEDURE:                    A vacuum-assisted vaginal delivery of a viale                               infant named Debra [name], who weighed 6 pounds 12                               ounces, and had Apgars of 8 and 9,  on February 28, 2002, attended by Janine Limbo, M.D.  HOSPITAL COURSE:              Ms. Lampley is a 30 year old Asian female, gravida 1, para 0, who presented at 39 weeks in early active labor.  She progressed to complete and pushing by approximately 2 p.m. on June 4 and then failed to continue descent spontaneously and was offered a vacuum-assisted vaginal delivery, which she accepted, and underwent the same for delivery of a viable female infant named Debra [name], who weighed 6 pounds 12 ounces and had Apgars of 8 and 9, on February 28, 2002, attended by Dr. Leonard Schwartz.  She did have a partial third degree laceration that was repaired without complication. Please see operative note for details.  Postpartally she has done well.  She is ambulating, voiding, and eating without difficulty.  Her vital signs are stable.  She has been afebrile since immediately postpartum when she had a temperature of 100.5.  She is breast-feeding without difficulty.  She desires an IUD for contraception.  DISCHARGE INSTRUCTIONS:       As per the [facility] OB[facility] handout.  DISCHARGE MEDICATIONS:        1. Motrin 600 mg p.o. q.6h. p.r.n. for pain.                               2. Tylox one to two p.o. q.4-6h. p.r.n. for                                  pain.                               3. Prenatal vitamin daily.  DISCHARGE LABORATORY DATA:    Her hemoglobin is 7.8, her WBC count is 18.0, and her platelets are 252.  DISCHARGE FOLLOW-UP:          In four weeks at [facility] in order to be scheduled for IUD placement two weeks later. Dictated by:   Vance Gather Duplantis, C.N.M. Attending Physician:  Leonard Schwartz DD:  03/02/02 TD:  03/05/02 Job: [phone]

## 2011-06-16 LAB — CROSSMATCH

## 2011-06-16 LAB — COMPREHENSIVE METABOLIC PANEL
ALT: 9
AST: 17
Alkaline Phosphatase: 159 — ABNORMAL HIGH
BUN: 2 — ABNORMAL LOW
CO2: 21
Creatinine, Ser: 0.52
Glucose, Bld: 78

## 2011-06-16 LAB — CBC
HCT: 29.7 — ABNORMAL LOW
HCT: 32.4 — ABNORMAL LOW
Hemoglobin: 9.6 — ABNORMAL LOW
MCHC: 32
MCHC: 34.5
MCHC: 34.7
MCV: 74 — ABNORMAL LOW
MCV: 81.4
MCV: 85.6
Platelets: 267
RBC: 3.03 — ABNORMAL LOW
RBC: 3.47 — ABNORMAL LOW
RBC: 3.96
RDW: 13.7
RDW: 17.6 — ABNORMAL HIGH
WBC: 24.4 — ABNORMAL HIGH

## 2011-06-16 LAB — ABO/RH: ABO/RH(D): O POS

## 2011-06-16 LAB — URINALYSIS, ROUTINE W REFLEX MICROSCOPIC
Bilirubin Urine: NEGATIVE
Glucose, UA: NEGATIVE
Hgb urine dipstick: NEGATIVE
Ketones, ur: NEGATIVE
Protein, ur: NEGATIVE
Urobilinogen, UA: 0.2

## 2011-06-16 LAB — HEMOGLOBIN AND HEMATOCRIT, BLOOD: HCT: 22.2 — ABNORMAL LOW

## 2011-06-16 LAB — RPR: RPR Ser Ql: NONREACTIVE

## 2011-06-16 LAB — DIFFERENTIAL
Eosinophils Absolute: 0
Neutrophils Relative %: 86 — ABNORMAL HIGH

## 2011-06-23 LAB — CBC
Hemoglobin: 12
MCV: 72.1 — ABNORMAL LOW
Platelets: 304
RBC: 5.19 — ABNORMAL HIGH
RDW: 15.1
WBC: 8.5

## 2011-06-23 LAB — URINALYSIS, ROUTINE W REFLEX MICROSCOPIC
Bilirubin Urine: NEGATIVE
Hgb urine dipstick: NEGATIVE
Nitrite: NEGATIVE
Specific Gravity, Urine: 1.02
Urobilinogen, UA: 0.2
pH: 6

## 2012-01-25 ENCOUNTER — Encounter: Payer: Self-pay | Admitting: Obstetrics and Gynecology

## 2012-01-25 ENCOUNTER — Ambulatory Visit (INDEPENDENT_AMBULATORY_CARE_PROVIDER_SITE_OTHER): Payer: BC Managed Care – PPO | Admitting: Obstetrics and Gynecology

## 2012-01-25 VITALS — BP 100/64 | Ht 58.5 in | Wt 114.0 lb

## 2012-01-25 DIAGNOSIS — Z309 Encounter for contraceptive management, unspecified: Secondary | ICD-10-CM

## 2012-01-25 DIAGNOSIS — Z124 Encounter for screening for malignant neoplasm of cervix: Secondary | ICD-10-CM

## 2012-01-25 MED ORDER — NORGESTREL-ETHINYL ESTRADIOL 0.3-30 MG-MCG PO TABS: 1.0000 | ORAL_TABLET | Freq: Every day | ORAL | Status: AC

## 2012-01-25 NOTE — Progress Notes (Signed)
The patient reports:normal menses, no abnormal bleeding, pelvic pain or discharge  Contraception:oral contraceptives (estrogen/progesterone) cryselle Last mammogram: patient has never had a mammogram  Last pap: was normal April  2012  GC/Chlamydia cultures offered: declined HIV/RPR/HbsAg offered:  declined HSV 1 and 2 glycoprotein offered: declined  Menstrual cycle regular and monthly: Yes Menstrual flow normal: Yes  Urinary symptoms: none Normal bowel movements: Yes Reports abuse at home: No  Pt without complaint Physical Examination: General appearance - alert, well appearing, and in no distress Neck - supple, no significant adenopathy Chest - clear to auscultation, no wheezes, rales or rhonchi, symmetric air entry Heart - normal rate, regular rhythm, normal S1, S2, no murmurs, rubs, clicks or gallops Abdomen - soft, nontender, nondistended, no masses or organomegaly Breasts - breasts appear normal, no suspicious masses, no skin or nipple changes or axillary nodes Pelvic - normal external genitalia, vulva, vagina, cervix, uterus and adnexa Rectal - normal rectal, no masses Back exam - full range of motion, no tenderness, palpable spasm or pain on motion Neurological - alert, oriented, normal speech, no focal findings or movement disorder noted Musculoskeletal - no joint tenderness, deformity or swelling Extremities - peripheral pulses normal, no pedal edema, no clubbing or cyanosis Skin - normal coloration and turgor, no rashes, no suspicious skin lesions noted Normal annual exam Pap done next due one year Pt desires to remain on ocps

## 2012-01-27 LAB — PAP IG W/ RFLX HPV ASCU

## 2012-01-31 ENCOUNTER — Telehealth: Payer: Self-pay

## 2012-01-31 NOTE — Telephone Encounter (Signed)
Spoke with pt informed pap results repeat 1 yr pt voice understanding

## 2012-01-31 NOTE — Telephone Encounter (Signed)
Message copied by Rolla Plate on Mon Jan 31, 2012 11:46 AM ------      Message from: Jaymes Graff      Created: Sun Jan 30, 2012 10:48 PM       Please tell patient her pap results and that she can repeat her pap in one year.  Thank you

## 2014-07-29 ENCOUNTER — Encounter: Payer: Self-pay | Admitting: Obstetrics and Gynecology

## 2014-08-28 ENCOUNTER — Encounter (HOSPITAL_COMMUNITY): Payer: Self-pay | Admitting: Emergency Medicine

## 2014-08-28 ENCOUNTER — Emergency Department (HOSPITAL_COMMUNITY)
Admission: EM | Admit: 2014-08-28 | Discharge: 2014-08-28 | Disposition: A | Discharge status: Home / Self Care | Payer: BC Managed Care – PPO | Source: Home / Self Care | Attending: Emergency Medicine | Admitting: Emergency Medicine

## 2014-08-28 DIAGNOSIS — J069 Acute upper respiratory infection, unspecified: Secondary | ICD-10-CM

## 2014-08-28 MED ORDER — GUAIFENESIN-CODEINE 100-10 MG/5ML PO SYRP: 5.0000 mL | ORAL_SOLUTION | Freq: Four times a day (QID) | ORAL | Status: DC | PRN

## 2014-08-28 NOTE — Discharge Instructions (Signed)
Upper Respiratory Infection, Adult Sudafed PE 10 mg  Allegra or claritin Lots of fluids An upper respiratory infection (URI) is also sometimes known as the common cold. The upper respiratory tract includes the nose, sinuses, throat, trachea, and bronchi. Bronchi are the airways leading to the lungs. Most people improve within 1 week, but symptoms can last up to 2 weeks. A residual cough may last even longer.  CAUSES Many different viruses can infect the tissues lining the upper respiratory tract. The tissues become irritated and inflamed and often become very moist. Mucus production is also common. A cold is contagious. You can easily spread the virus to others by oral contact. This includes kissing, sharing a glass, coughing, or sneezing. Touching your mouth or nose and then touching a surface, which is then touched by another person, can also spread the virus. SYMPTOMS  Symptoms typically develop 1 to 3 days after you come in contact with a cold virus. Symptoms vary from person to person. They may include:  Runny nose.  Sneezing.  Nasal congestion.  Sinus irritation.  Sore throat.  Loss of voice (laryngitis).  Cough.  Fatigue.  Muscle aches.  Loss of appetite.  Headache.  Low-grade fever. DIAGNOSIS  You might diagnose your own cold based on familiar symptoms, since most people get a cold 2 to 3 times a year. Your caregiver can confirm this based on your exam. Most importantly, your caregiver can check that your symptoms are not due to another disease such as strep throat, sinusitis, pneumonia, asthma, or epiglottitis. Blood tests, throat tests, and X-rays are not necessary to diagnose a common cold, but they may sometimes be helpful in excluding other more serious diseases. Your caregiver will decide if any further tests are required. RISKS AND COMPLICATIONS  You may be at risk for a more severe case of the common cold if you smoke cigarettes, have chronic heart disease (such as  heart failure) or lung disease (such as asthma), or if you have a weakened immune system. The very young and very old are also at risk for more serious infections. Bacterial sinusitis, middle ear infections, and bacterial pneumonia can complicate the common cold. The common cold can worsen asthma and chronic obstructive pulmonary disease (COPD). Sometimes, these complications can require emergency medical care and may be life-threatening. PREVENTION  The best way to protect against getting a cold is to practice good hygiene. Avoid oral or hand contact with people with cold symptoms. Wash your hands often if contact occurs. There is no clear evidence that vitamin C, vitamin E, echinacea, or exercise reduces the chance of developing a cold. However, it is always recommended to get plenty of rest and practice good nutrition. TREATMENT  Treatment is directed at relieving symptoms. There is no cure. Antibiotics are not effective, because the infection is caused by a virus, not by bacteria. Treatment may include:  Increased fluid intake. Sports drinks offer valuable electrolytes, sugars, and fluids.  Breathing heated mist or steam (vaporizer or shower).  Eating chicken soup or other clear broths, and maintaining good nutrition.  Getting plenty of rest.  Using gargles or lozenges for comfort.  Controlling fevers with ibuprofen or acetaminophen as directed by your caregiver.  Increasing usage of your inhaler if you have asthma. Zinc gel and zinc lozenges, taken in the first 24 hours of the common cold, can shorten the duration and lessen the severity of symptoms. Pain medicines may help with fever, muscle aches, and throat pain. A variety of non-prescription  medicines are available to treat congestion and runny nose. Your caregiver can make recommendations and may suggest nasal or lung inhalers for other symptoms.  HOME CARE INSTRUCTIONS   Only take over-the-counter or prescription medicines for pain,  discomfort, or fever as directed by your caregiver.  Use a warm mist humidifier or inhale steam from a shower to increase air moisture. This may keep secretions moist and make it easier to breathe.  Drink enough water and fluids to keep your urine clear or pale yellow.  Rest as needed.  Return to work when your temperature has returned to normal or as your caregiver advises. You may need to stay home longer to avoid infecting others. You can also use a face mask and careful hand washing to prevent spread of the virus. SEEK MEDICAL CARE IF:   After the first few days, you feel you are getting worse rather than better.  You need your caregiver's advice about medicines to control symptoms.  You develop chills, worsening shortness of breath, or brown or red sputum. These may be signs of pneumonia.  You develop yellow or brown nasal discharge or pain in the face, especially when you bend forward. These may be signs of sinusitis.  You develop a fever, swollen neck glands, pain with swallowing, or white areas in the back of your throat. These may be signs of strep throat. SEEK IMMEDIATE MEDICAL CARE IF:   You have a fever.  You develop severe or persistent headache, ear pain, sinus pain, or chest pain.  You develop wheezing, a prolonged cough, cough up blood, or have a change in your usual mucus (if you have chronic lung disease).  You develop sore muscles or a stiff neck. Document Released: 03/09/2001 Document Revised: 12/06/2011 Document Reviewed: 12/19/2013 [facility] Patient Information 2015 [place]. This information is not intended to replace advice given to you by your health care provider. Make sure you discuss any questions you have with your health care provider.

## 2014-08-28 NOTE — ED Notes (Signed)
C/o cold sx onset Monday Sx include: productive cough, chest/throat d/c due to cough, runny nose, congestion Alert, no signs of acute distress.

## 2014-08-28 NOTE — ED Provider Notes (Signed)
CSN: [id number]     Arrival date & time 08/28/14  [id number] History   First MD Initiated Contact with Patient 08/28/14 [id number]     Chief Complaint  Patient presents with  . URI   (Consider location/radiation/quality/duration/timing/severity/associated sxs/prior Treatment) HPI Comments: C/o URI sx's  Patient is a 33 y.o. female presenting with URI.  URI Presenting symptoms: congestion, cough, rhinorrhea and sore throat   Presenting symptoms: no ear pain, no facial pain and no fever   Severity:  Mild Onset quality:  Gradual Duration:  2 days Timing:  Constant Progression:  Unchanged Chronicity:  New Relieved by:  None tried Worsened by:  Nothing tried Associated symptoms: sneezing   Associated symptoms: no arthralgias, no headaches, no myalgias, no neck pain, no sinus pain, no swollen glands and no wheezing     History reviewed. No pertinent past medical history. Past Surgical History  Procedure Laterality Date  . Laparoscopy  01/2008    removal of IUD   Family History  Problem Relation Age of Onset  . Diabetes Maternal Grandfather    History  Substance Use Topics  . Smoking status: Never Smoker   . Smokeless tobacco: Never Used  . Alcohol Use: No   OB History    Gravida Para Term Preterm AB TAB SAB Ectopic Multiple Living   3 2             Review of Systems  Constitutional: Negative for fever and chills.  HENT: Positive for congestion, postnasal drip, rhinorrhea, sneezing and sore throat. Negative for ear pain, facial swelling and voice change.   Respiratory: Positive for cough. Negative for shortness of breath and wheezing.   Gastrointestinal: Negative.   Musculoskeletal: Negative for myalgias, arthralgias and neck pain.  Neurological: Negative for headaches.    Allergies  Review of patient's allergies indicates no known allergies.  Home Medications   Prior to Admission medications   Medication Sig Start Date End Date Taking? Authorizing Provider   guaiFENesin-codeine (CHERATUSSIN AC) 100-10 MG/5ML syrup Take 5 mLs by mouth 4 (four) times daily as needed for cough or congestion. 08/28/14   Hayden Rasmussenavid Maurion Walkowiak, NP  norgestrel-ethinyl estradiol (LO/OVRAL,CRYSELLE) 0.3-30 MG-MCG tablet Take 1 tablet by mouth daily. 01/25/12   Naima Dillard, MD   BP 127/83 mmHg  Pulse 104  Temp(Src) 98.4 F (36.9 C) (Oral)  Resp 16  SpO2 100% Physical Exam  Constitutional: She is oriented to person, place, and time. She appears well-developed and well-nourished. No distress.  HENT:  Mouth/Throat: No oropharyngeal exudate.  Biat TM's nl OP with small amt of clear PND  Eyes: Conjunctivae and EOM are normal.  Neck: Normal range of motion. Neck supple.  Cardiovascular: Normal rate, regular rhythm and normal heart sounds.   Pulmonary/Chest: Effort normal and breath sounds normal. No respiratory distress. She has no wheezes. She has no rales.  Musculoskeletal: Normal range of motion. She exhibits no edema.  Lymphadenopathy:    She has no cervical adenopathy.  Neurological: She is alert and oriented to person, place, and time.  Skin: Skin is warm and dry. No rash noted.  Psychiatric: She has a normal mood and affect.  Nursing note and vitals reviewed.   ED Course  Procedures (including critical care time) Labs Review Labs Reviewed - No data to display  Imaging Review No results found.   MDM   1. URI (upper respiratory infection)    Sudafed PE 10 mg  Allegra or claritin Lots of fluids Cheratussin AC 120 ml. No rf  Hayden Rasmussenavid Janeisha Ryle, NP 08/28/14 [phone]

## 2016-06-24 ENCOUNTER — Emergency Department (HOSPITAL_COMMUNITY)
Admission: EM | Admit: 2016-06-24 | Discharge: 2016-06-24 | Disposition: A | Discharge status: Home / Self Care | Payer: BLUE CROSS/BLUE SHIELD | Attending: Emergency Medicine | Admitting: Emergency Medicine

## 2016-06-24 DIAGNOSIS — T1491 Suicide attempt: Secondary | ICD-10-CM | POA: Diagnosis present

## 2016-06-24 DIAGNOSIS — F329 Major depressive disorder, single episode, unspecified: Secondary | ICD-10-CM | POA: Diagnosis not present

## 2016-06-24 DIAGNOSIS — Z79899 Other long term (current) drug therapy: Secondary | ICD-10-CM | POA: Insufficient documentation

## 2016-06-24 DIAGNOSIS — F32A Depression, unspecified: Secondary | ICD-10-CM

## 2016-06-24 LAB — CBC
HEMATOCRIT: 37.8 % (ref 36.0–46.0)
HEMOGLOBIN: 11.9 g/dL — AB (ref 12.0–15.0)
MCH: 22.4 pg — AB (ref 26.0–34.0)
MCHC: 31.5 g/dL (ref 30.0–36.0)
MCV: 71.1 fL — AB (ref 78.0–100.0)
Platelets: 311 10*3/uL (ref 150–400)
RBC: 5.32 MIL/uL — ABNORMAL HIGH (ref 3.87–5.11)
RDW: 13.7 % (ref 11.5–15.5)
WBC: 8.2 10*3/uL (ref 4.0–10.5)

## 2016-06-24 LAB — COMPREHENSIVE METABOLIC PANEL
ALBUMIN: 4.7 g/dL (ref 3.5–5.0)
ALK PHOS: 57 U/L (ref 38–126)
ALT: 20 U/L (ref 14–54)
ANION GAP: 9 (ref 5–15)
AST: 25 U/L (ref 15–41)
BILIRUBIN TOTAL: 0.7 mg/dL (ref 0.3–1.2)
BUN: 10 mg/dL (ref 6–20)
CALCIUM: 9 mg/dL (ref 8.9–10.3)
CO2: 20 mmol/L — AB (ref 22–32)
CREATININE: 0.63 mg/dL (ref 0.44–1.00)
Chloride: 109 mmol/L (ref 101–111)
GFR calc Af Amer: >60 mL/min (ref >60)
GFR calc non Af Amer: >60 mL/min (ref >60)
GLUCOSE: 116 mg/dL — AB (ref 65–99)
Potassium: 3.8 mmol/L (ref 3.5–5.1)
SODIUM: 138 mmol/L (ref 135–145)
TOTAL PROTEIN: 8.2 g/dL — AB (ref 6.5–8.1)

## 2016-06-24 LAB — ETHANOL: Alcohol, Ethyl (B): <5 mg/dL (ref <5)

## 2016-06-24 LAB — SALICYLATE LEVEL: Salicylate Lvl: <4 mg/dL (ref 2.8–30.0)

## 2016-06-24 LAB — ACETAMINOPHEN LEVEL

## 2016-06-24 LAB — RAPID URINE DRUG SCREEN, HOSP PERFORMED
Amphetamines: NOT DETECTED
BARBITURATES: NOT DETECTED
Benzodiazepines: NOT DETECTED
Cocaine: NOT DETECTED
Opiates: NOT DETECTED
TETRAHYDROCANNABINOL: NOT DETECTED

## 2016-06-24 LAB — CBG MONITORING, ED: GLUCOSE-CAPILLARY: 215 mg/dL — AB (ref 65–99)

## 2016-06-24 MED ORDER — CHARCOAL ACTIVATED PO LIQD
50.0000 g | Freq: Once | ORAL | Status: AC
  Administered 2016-06-24: 50 g via ORAL
  Filled 2016-06-24: qty 240

## 2016-06-24 NOTE — ED Notes (Signed)
Poison Control has cleared this pt.

## 2016-06-24 NOTE — ED Triage Notes (Signed)
BIB EMS, pt was found by her husband with what he states a "handful" of Tylenol in her mouth. Husband suspects that she swallowed x3 Tylenol 500mg  tablets. Pt stated to EMS "I want to hurt myslef, that's why I took the Tylenol"  Pt arrives A+OX4, in no acute distress.  EMS Vitals   BP 132/98 HR 98 RR 18

## 2016-06-24 NOTE — ED Provider Notes (Addendum)
Patient signed out to me at shift change by R Browning [name]. Patient is 35 year old female who presents with intention of Tylenol OD however only took ~3 pill after being stopped by her husband. Poison control was consulted who recommended to repeat Tylenol level after 4 hours. If Tylenol is <10, consult TTS. If Tylenol is elevated, re-consult poison control.  Tylenol is <10. Will consult TTS. Patient is medically cleared.  TTS advised patient can be discharged with outpatient follow up. Resources given. Please see their note for details.    [name], [name] 06/24/16 1556    [name], MD 06/24/16 [id number]1740    Debra Marie MaranaGekas, [name] 06/24/16 2102    [name], MD 06/25/16 [phone]

## 2016-06-24 NOTE — ED Notes (Signed)
Bed: [id number] Expected date:  Expected time:  Means of arrival:  Comments: EMS- 35yo F, OD/SI

## 2016-06-24 NOTE — ED Notes (Signed)
PA at bedside.

## 2016-06-24 NOTE — ED Provider Notes (Signed)
[facility]-EMERGENCY DEPT Provider Note   CSN: [id number] Arrival date & time: 9/28/[id number]  1157     History   Chief Complaint Chief Complaint  Patient presents with  . Suicidal  . Drug Overdose    Tylenol    HPI Debra [name] is a 35 y.o. female.  Patient presents to the emergency department with chief complaint of suicide attempt. She states that she has been feeling very depressed and sad lately. She is not further expound upon this. She states that she attempt to take a handful of Tylenol in an attempt to hurt herself today. The initial ingestion occurred at 11 AM. Per her husband, he states that he was able to get most of the Tylenol out of her hand and her mouth, but suspects that she may have swallowed about 3 tablets of Tylenol 500 mg. She denies any other problems. Denies any other associated symptoms. There are no modifying factors. Denies any other coingestants. Denies any illicit drug use. Denies any alcohol use.   The history is provided by the patient. No language interpreter was used.    No past medical history on file.  There are no active problems to display for this patient.   Past Surgical History:  Procedure Laterality Date  . LAPAROSCOPY  01/2008   removal of IUD    OB History    Gravida Para Term Preterm AB Living   3 2           SAB TAB Ectopic Multiple Live Births                   Home Medications    Prior to Admission medications   Medication Sig Start Date End Date Taking? Authorizing Provider  norgestrel-ethinyl estradiol (LO/OVRAL,CRYSELLE) 0.3-30 MG-MCG tablet Take 1 tablet by mouth daily. 01/25/12  Yes Jaymes GraffNaima Dillard, MD    Family History Family History  Problem Relation Age of Onset  . Diabetes Maternal Grandfather     Social History Social History  Substance Use Topics  . Smoking status: Never Smoker  . Smokeless tobacco: Never Used  . Alcohol use No     Allergies   Review of patient's allergies indicates no known  allergies.   Review of Systems Review of Systems  Psychiatric/Behavioral: Positive for self-injury and suicidal ideas.  All other systems reviewed and are negative.    Physical Exam Updated Vital Signs BP 125/86 (BP Location: Right Arm)   Pulse 110   Temp 98.6 F (37 C) (Oral)   Resp 19   Ht 4\' 8"  (1.422 m)   Wt 62.6 kg   SpO2 100%   BMI 30.94 kg/m   Physical Exam  Constitutional: She is oriented to person, place, and time. She appears well-developed and well-nourished.  HENT:  Head: Normocephalic and atraumatic.  Eyes: Conjunctivae and EOM are normal. Pupils are equal, round, and reactive to light.  Neck: Normal range of motion. Neck supple.  Cardiovascular: Regular rhythm.  Exam reveals no gallop and no friction rub.   No murmur heard. Mild tachycardia  Pulmonary/Chest: Effort normal and breath sounds normal. No respiratory distress. She has no wheezes. She has no rales. She exhibits no tenderness.  Abdominal: Soft. Bowel sounds are normal. She exhibits no distension and no mass. There is no tenderness. There is no rebound and no guarding.  Musculoskeletal: Normal range of motion. She exhibits no edema or tenderness.  Neurological: She is alert and oriented to person, place, and time.  Skin: Skin is  warm and dry.  Psychiatric: She has a normal mood and affect. Her behavior is normal. Judgment and thought content normal.  Nursing note and vitals reviewed.    ED Treatments / Results  Labs (all labs ordered are listed, but only abnormal results are displayed) Labs Reviewed  COMPREHENSIVE METABOLIC PANEL - Abnormal; Notable for the following:       Result Value   CO2 20 (*)    Glucose, Bld 116 (*)    Total Protein 8.2 (*)    All other components within normal limits  ACETAMINOPHEN LEVEL - Abnormal; Notable for the following:    Acetaminophen (Tylenol), Serum <10 (*)    All other components within normal limits  CBC - Abnormal; Notable for the following:    RBC  5.32 (*)    Hemoglobin 11.9 (*)    MCV 71.1 (*)    MCH 22.4 (*)    All other components within normal limits  CBG MONITORING, ED - Abnormal; Notable for the following:    Glucose-Capillary 215 (*)    All other components within normal limits  ETHANOL  SALICYLATE LEVEL  URINE RAPID DRUG SCREEN, HOSP PERFORMED  ACETAMINOPHEN LEVEL    EKG  EKG Interpretation  Date/Time:  Thursday June 24 2016 12:16:07 EDT Ventricular Rate:  114 PR Interval:    QRS Duration: 70 QT Interval:  314 QTC Calculation: 433 R Axis:   73 Text Interpretation:  Sinus tachycardia No previous ECGs available Confirmed by YAO  MD, DAVID ([id number]) on 06/24/2016 1:50:24 PM       Radiology No results found.  Procedures Procedures (including critical care time)  Medications Ordered in ED Medications  charcoal activated (NO SORBITOL) (ACTIDOSE-AQUA) suspension 50 g (50 g Oral Given 06/24/16 1240)     Initial Impression / Assessment and Plan / ED Course  I have reviewed the triage vital signs and the nursing notes.  Pertinent labs & imaging results that were available during my care of the patient were reviewed by me and considered in my medical decision making (see chart for details).  Clinical Course    Patient with ingestion of unknown amount of Tylenol. Suspect that the total ingestion was not more than a few tablets. The husband was able intervene. Poison control was notified. It was recommended that the patient receive activated charcoal without sorbitol. This was given following conversation with poison control. Recommend trending Tylenol levels and getting a repeat Tylenol level at 3:00.  Patient may require medical admission depending upon this result. She will also need to be seen by behavioral health if medically cleared.  EKG shows sinus tach as above.  Patient signed out to oncoming team, Jefm Bryant, [name].  Plan:  Follow-up on repeat tylenol level, if elevated contact poison control.  If  4-hr tylenol level is normal, then medically cleared and TTS consult for SI.   Patient discussed with Dr. Silverio Lay, who agrees with the plan.    Final Clinical Impressions(s) / ED Diagnoses   Final diagnoses:  None    New Prescriptions New Prescriptions   No medications on file     Roxy Horseman, [name] 06/24/16 1523    Roxy Horseman, [name] 06/24/16 1525    Charlynne Pander, MD 06/24/16 1553

## 2016-06-24 NOTE — BH Assessment (Addendum)
Tele Assessment Note   Debra [name] is an 35 y.o. female presenting voluntarily and accompanied by husband with c/o intentional ingestion of Tylenol 500mg . [place] is [place] and declined use of interpreter. Pt is cooperative yet guarded during assessment and states that she does not feel comfortable disclosing in detail. Pt has no psychiatric history and no history of substance use. 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 Husband reports he and pt had a discussion this morning concerning her wanting to get a second job. 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 Pt expressed financial concerns which husband stated would not be an issue. Husband left the home and upon return found pt with Tylenol. Husband removed Tylenol from pt's possession.   Pt states she had "crazy thoughts" and ingested Tylenol on impulse. Pt denies suicidal ideation and states that she does not want to die. Pt identified several protective factors including her two children. Pt is adamant that she is able to contract for safety and expresses wishes to be discharged home.  Husband states that he is able to ensure pt's safety in the home setting. Pt resides with husband, two children, and her sister. Pt and husband deny any history of self-harm or previous suicide attempts. Pt denies homicidal ideations and reports no access to weapons. Pt denies hallucinations and does not appear to be responding to internal stimuli or experiencing delusional thought content.  Diagnosis: F32.1 Major depressive disorder, single episode, moderate   Past Medical History: No past medical history on file.  Past Surgical History:  Procedure Laterality Date  . LAPAROSCOPY  01/2008   removal of IUD    Family History:  Family History  Problem Relation Age of Onset  . Diabetes Maternal Grandfather     Social History:  reports that she has never  smoked. She has never used smokeless tobacco. She reports that she does not drink alcohol or use drugs.  Additional Social History:  Alcohol / Drug Use Pain Medications: Pt denies abuse. Prescriptions: Pt denies abuse.  Over the Counter: Pt denies abuse History of alcohol / drug use?: No history of alcohol / drug abuse  CIWA: CIWA-Ar BP: 110/80 Pulse Rate: 101 COWS:    PATIENT STRENGTHS: (choose at least two) Average or above average intelligence General fund of knowledge  Allergies: No Known Allergies  Home Medications:  (Not in a hospital admission)  OB/GYN Status:  No LMP recorded.  General Assessment Data Location of Assessment: [facility] TTS Assessment: In system Is this a Tele or Face-to-Face Assessment?: Face-to-Face Marital status: Married Allen name: Cerros Is patient pregnant?: Unknown Pregnancy Status: Unknown Living Arrangements: Spouse/significant other, Children, Other relatives (sister) Can pt return to current living arrangement?: Yes Admission Status: Voluntary Is patient capable of signing voluntary admission?: Yes Referral Source: Self/Family/Friend Insurance type: BCBS     Crisis Care Plan Living Arrangements: Spouse/significant other, Children, Other relatives (sister) Name of Psychiatrist: None Name of Therapist: None  Education Status Is patient currently in school?: No Highest grade of school patient has completed: Enrolled @ [employer]  Risk to self with the past 6 months Suicidal Ideation: No (Pt denies @ time of assessment) Has patient been a risk to self within the past 6 months prior to admission? : No Suicidal Intent: No (Pt denies) Has patient had any suicidal intent within the past 6 months prior to admission? : No Is  patient at risk for suicide?: Yes Suicidal Plan?: No Has patient had any suicidal plan within the past 6 months prior to admission? :  (Pt denies however presents w/ c/o tylenol ingestion) Access to Means: Yes Specify Access  to Suicidal Means: Access to OTC medications What has been your use of drugs/alcohol within the last 12 months?: Pt denies use Previous Attempts/Gestures: No Other Self Harm Risks: None Reported Intentional Self Injurious Behavior: None Family Suicide History: No Recent stressful life event(s): Financial Problems, Loss (Comment) (loss of sister  last year) Persecutory voices/beliefs?: No Depression: Yes Depression Symptoms: Tearfulness, Isolating Substance abuse history and/or treatment for substance abuse?: No Suicide prevention information given to non-admitted patients: Yes  Risk to Others within the past 6 months Homicidal Ideation: No Does patient have any lifetime risk of violence toward others beyond the six months prior to admission? : No Thoughts of Harm to Others: No Current Homicidal Intent: No Current Homicidal Plan: No Access to Homicidal Means: No History of harm to others?: No Assessment of Violence: None Noted Does patient have access to weapons?: No Criminal Charges Pending?: No Does patient have a court date: No Is patient on probation?: No  Psychosis Hallucinations: None noted Delusions: None noted  Mental Status Report Appearance/Hygiene: In scrubs Eye Contact: Fair Motor Activity: Unremarkable Speech: Logical/coherent, Soft Level of Consciousness: Crying, Alert (crying intermittently ) Mood: Sad Affect: Other (Comment) (congruent) Anxiety Level: Minimal Thought Processes: Coherent, Relevant Judgement: Unimpaired Orientation: Person, Place, Time, Situation Obsessive Compulsive Thoughts/Behaviors: None  Cognitive Functioning Concentration: Normal Memory: Recent Intact, Remote Intact IQ: Average Insight: Fair Impulse Control: Fair Appetite: Good Weight Loss:  (Not Reported) Weight Gain:  (Not Reported) Sleep:  (Not Reported) Total Hours of Sleep:  (Not Reported) Vegetative Symptoms: None  ADLScreening [facility] Assessment Services) Patient's  cognitive ability adequate to safely complete daily activities?: Yes Patient able to express need for assistance with ADLs?: Yes Independently performs ADLs?: Yes (appropriate for developmental age)  Prior Inpatient Therapy Prior Inpatient Therapy: No  Prior Outpatient Therapy Prior Outpatient Therapy: No Does patient have an ACCT team?: No Does patient have Intensive In-House Services?  : No Does patient have [facility] services? : No Does patient have [facility] services?: No  ADL Screening (condition at time of admission) Patient's cognitive ability adequate to safely complete daily activities?: Yes Is the patient deaf or have difficulty hearing?: No Does the patient have difficulty seeing, even when wearing glasses/contacts?: No Does the patient have difficulty concentrating, remembering, or making decisions?: No Patient able to express need for assistance with ADLs?: Yes Does the patient have difficulty dressing or bathing?: No Independently performs ADLs?: Yes (appropriate for developmental age) Does the patient have difficulty walking or climbing stairs?: No Weakness of Legs: None Weakness of Arms/Hands: None  Home Assistive Devices/Equipment Home Assistive Devices/Equipment: None  Therapy Consults (therapy consults require a physician order) PT Evaluation Needed: No OT Evalulation Needed: No SLP Evaluation Needed: No Abuse/Neglect Assessment (Assessment to be complete while patient is alone) Physical Abuse: Denies Verbal Abuse: Denies Sexual Abuse: Denies Exploitation of patient/patient's resources: Denies Self-Neglect: Denies Values / Beliefs Cultural Requests During Hospitalization: None Spiritual Requests During Hospitalization: None Consults Spiritual Care Consult Needed: No Social Work Consult Needed: No Merchant navy officerAdvance Directives (For Healthcare) Does patient have an advance directive?: No Would patient like information on creating an advanced directive?: No - patient  declined information    Additional Information 1:1 In Past 12 Months?: No CIRT Risk: No Elopement Risk: No Does patient  have medical clearance?: Yes     Disposition: Clinician consulted with Maryjean Morn PA and pt does not meet inpatient criteria. Pt is recommend to follow-up with outpatient resources provided by and discussed with clinician. Terance Hart, [name] informed of pt disposition.  Disposition Initial Assessment Completed for this Encounter: Yes Disposition of Patient: Other dispositions Other disposition(s): Other (Comment) (Pending Psychiatric Recommendation)  Ronelle Michie J [place] 06/24/2016 8:38 PM
